# Patient Record
Sex: Male | Born: 1985 | Race: Black or African American | Hispanic: No | Marital: Single | Smoking: Former smoker
Health system: Southern US, Community
[De-identification: ages and names within clinical notes are randomized; demographics above are authoritative.]

## PROBLEM LIST (undated history)

## (undated) HISTORY — PX: HAND SURGERY: SHX662

---
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
## 2010-12-02 ENCOUNTER — Encounter: Payer: Self-pay | Admitting: Emergency Medicine

## 2010-12-02 ENCOUNTER — Emergency Department (HOSPITAL_COMMUNITY)
Admission: EM | Admit: 2010-12-02 | Discharge: 2010-12-02 | Disposition: A | Discharge status: Home / Self Care | Payer: Self-pay | Attending: Emergency Medicine | Admitting: Emergency Medicine

## 2010-12-02 DIAGNOSIS — F17200 Nicotine dependence, unspecified, uncomplicated: Secondary | ICD-10-CM | POA: Insufficient documentation

## 2010-12-02 DIAGNOSIS — N342 Other urethritis: Secondary | ICD-10-CM | POA: Insufficient documentation

## 2010-12-02 LAB — URINE MICROSCOPIC-ADD ON

## 2010-12-02 LAB — URINALYSIS, ROUTINE W REFLEX MICROSCOPIC
Glucose, UA: NEGATIVE mg/dL
Ketones, ur: NEGATIVE mg/dL
Protein, ur: NEGATIVE mg/dL
Urobilinogen, UA: 0.2 mg/dL (ref 0.0–1.0)

## 2010-12-02 MED ORDER — LIDOCAINE HCL (PF) 1 % IJ SOLN
INTRAMUSCULAR | Status: AC
  Administered 2010-12-02: 5 mL
  Filled 2010-12-02: qty 5

## 2010-12-02 MED ORDER — CEFTRIAXONE SODIUM 1 G IJ SOLR
250.0000 mg | Freq: Once | INTRAMUSCULAR | Status: AC
  Administered 2010-12-02: 250 mg via INTRAMUSCULAR

## 2010-12-02 MED ORDER — AZITHROMYCIN 250 MG PO TABS
1000.0000 mg | ORAL_TABLET | Freq: Once | ORAL | Status: AC
  Administered 2010-12-02: 1000 mg via ORAL
  Filled 2010-12-02: qty 4

## 2010-12-02 MED ORDER — CEFTRIAXONE SODIUM 250 MG IJ SOLR
INTRAMUSCULAR | Status: AC
  Filled 2010-12-02: qty 250

## 2010-12-02 NOTE — ED Provider Notes (Signed)
History     CSN: [id number] Arrival date & time: 12/02/2010  8:08 AM   First MD Initiated Contact with Patient 12/02/10 [phone]      Chief Complaint  Patient presents with  . Exposure to STD    (Consider location/radiation/quality/duration/timing/severity/associated sxs/prior treatment) Patient is a 25 y.o. male presenting with STD exposure. The history is provided by the patient.  Exposure to STD This is a new problem. The current episode started yesterday. Episode frequency: He reports burning with urination and urethral discharge since yesterday.  He has had unprotected sex with more than one partner in the recent past. The problem has been unchanged. Pertinent negatives include no abdominal pain, arthralgias, chest pain, chills, congestion, fever, headaches, joint swelling, nausea, neck pain, numbness, rash, sore throat, swollen glands or weakness. Exacerbated by: Urination. He has tried nothing for the symptoms.    History reviewed. No pertinent past medical history.  Past Surgical History  Procedure Date  . Hand surgery     Family History  Problem Relation Age of Onset  . Diabetes Sister     History  Substance Use Topics  . Smoking status: Current Everyday Smoker  . Smokeless tobacco: Not on file  . Alcohol Use: Yes      Review of Systems  Constitutional: Negative for fever and chills.  HENT: Negative for congestion, sore throat and neck pain.   Eyes: Negative.   Respiratory: Negative for chest tightness and shortness of breath.   Cardiovascular: Negative for chest pain.  Gastrointestinal: Negative for nausea and abdominal pain.  Genitourinary: Positive for dysuria and discharge.  Musculoskeletal: Negative for joint swelling and arthralgias.  Skin: Negative.  Negative for rash and wound.  Neurological: Negative for dizziness, weakness, light-headedness, numbness and headaches.  Hematological: Negative.   Psychiatric/Behavioral: Negative.     Allergies    Review of patient's allergies indicates no known allergies.  Home Medications  No current outpatient prescriptions on file.  BP 116/75  Pulse 66  Temp 97.9 F (36.6 C)  Resp 20  Ht 6\' 2"  (1.88 m)  Wt 175 lb (79.379 kg)  BMI 22.47 kg/m2  SpO2 99%  Physical Exam  Nursing note and vitals reviewed. Constitutional: He is oriented to person, place, and time. He appears well-developed and well-nourished.  HENT:  Head: Normocephalic and atraumatic.  Eyes: Conjunctivae are normal.  Neck: Normal range of motion.  Cardiovascular: Normal rate, regular rhythm, normal heart sounds and intact distal pulses.   Pulmonary/Chest: Effort normal and breath sounds normal. He has no wheezes.  Abdominal: Soft. Bowel sounds are normal. There is no tenderness.  Genitourinary: Testes normal. Circumcised. No penile tenderness. Discharge found.  Musculoskeletal: Normal range of motion.  Neurological: He is alert and oriented to person, place, and time.  Skin: Skin is warm and dry.  Psychiatric: He has a normal mood and affect.    ED Course  Procedures (including critical care time)  Labs Reviewed  URINALYSIS, ROUTINE W REFLEX MICROSCOPIC - Abnormal; Notable for the following:    Appearance HAZY (*)    Hgb urine dipstick TRACE (*)    Leukocytes, UA SMALL (*)    All other components within normal limits  URINE MICROSCOPIC-ADD ON - Abnormal; Notable for the following:    Bacteria, UA MANY (*)    All other components within normal limits  GC/CHLAMYDIA PROBE AMP, GENITAL  RPR   No results found.   No diagnosis found.    MDM  Patient treated with zithromax 1 gram  PO and rocephin 250 mg IM,  Gc/chlamydia/rpr cultures pending.        Candis Musa, PA 12/02/10 [phone]

## 2010-12-02 NOTE — ED Notes (Signed)
Pt c/o recent exposure to std. Pt c/o d/c from penis.

## 2010-12-02 NOTE — ED Provider Notes (Signed)
Medical screening examination/treatment/procedure(s) were performed by non-physician practitioner and as supervising physician I was immediately available for consultation/collaboration.   Nelia Shi, MD 12/02/10 [phone]

## 2010-12-05 LAB — GC/CHLAMYDIA PROBE AMP, GENITAL: GC Probe Amp, Genital: POSITIVE — AB

## 2011-01-24 ENCOUNTER — Encounter (HOSPITAL_COMMUNITY): Payer: Self-pay

## 2011-01-24 ENCOUNTER — Emergency Department (HOSPITAL_COMMUNITY)
Admission: EM | Admit: 2011-01-24 | Discharge: 2011-01-24 | Disposition: A | Discharge status: Home / Self Care | Payer: Self-pay | Attending: Emergency Medicine | Admitting: Emergency Medicine

## 2011-01-24 DIAGNOSIS — Z202 Contact with and (suspected) exposure to infections with a predominantly sexual mode of transmission: Secondary | ICD-10-CM | POA: Insufficient documentation

## 2011-01-24 DIAGNOSIS — R300 Dysuria: Secondary | ICD-10-CM | POA: Insufficient documentation

## 2011-01-24 DIAGNOSIS — F17200 Nicotine dependence, unspecified, uncomplicated: Secondary | ICD-10-CM | POA: Insufficient documentation

## 2011-01-24 LAB — URINALYSIS, ROUTINE W REFLEX MICROSCOPIC
Leukocytes, UA: NEGATIVE
Nitrite: NEGATIVE
Protein, ur: NEGATIVE mg/dL
Urobilinogen, UA: 2 mg/dL — ABNORMAL HIGH (ref 0.0–1.0)

## 2011-01-24 MED ORDER — AZITHROMYCIN 250 MG PO TABS
1000.0000 mg | ORAL_TABLET | Freq: Once | ORAL | Status: AC
  Administered 2011-01-24: 1000 mg via ORAL
  Filled 2011-01-24: qty 4

## 2011-01-24 MED ORDER — CEFTRIAXONE SODIUM 250 MG IJ SOLR
250.0000 mg | Freq: Once | INTRAMUSCULAR | Status: AC
  Administered 2011-01-24: 250 mg via INTRAMUSCULAR
  Filled 2011-01-24: qty 250

## 2011-01-24 MED ORDER — LIDOCAINE HCL (PF) 1 % IJ SOLN
INTRAMUSCULAR | Status: AC
  Administered 2011-01-24: 5 mL
  Filled 2011-01-24: qty 5

## 2011-01-24 NOTE — ED Provider Notes (Signed)
History     CSN: [id number] Arrival date & time: 01/24/2011  1:21 PM   First MD Initiated Contact with Patient 01/24/11 1354      Chief Complaint  Patient presents with  . Exposure to STD    (Consider location/radiation/quality/duration/timing/severity/associated sxs/prior treatment) HPI Comments: Pt recently had intercourse with male who reportedly has chlamydia.  Pt wants to be treated.  Denies penile d/c.  + dysuria o/w no UTI sxs.  Patient is a 25 y.o. male presenting with STD exposure. The history is provided by the patient. No language interpreter was used.  Exposure to STD This is a new problem. Episode onset: severa; days ago. The problem occurs constantly. The problem has been unchanged. Associated symptoms include urinary symptoms. He has tried nothing for the symptoms.    History reviewed. No pertinent past medical history.  Past Surgical History  Procedure Date  . Hand surgery     Family History  Problem Relation Age of Onset  . Diabetes Sister     History  Substance Use Topics  . Smoking status: Current Everyday Smoker  . Smokeless tobacco: Not on file  . Alcohol Use: Yes      Review of Systems  Genitourinary: Positive for dysuria. Negative for discharge.  All other systems reviewed and are negative.    Allergies  Bee venom  Home Medications   Current Outpatient Rx  Name Route Sig Dispense Refill  . THERA M PLUS PO TABS Oral Take 1 tablet by mouth daily.        BP 114/59  Pulse 71  Temp(Src) 98.3 F (36.8 C) (Oral)  Resp 18  Ht 6\' 2"  (1.88 m)  Wt 170 lb (77.111 kg)  BMI 21.83 kg/m2  SpO2 100%  Physical Exam  Nursing note and vitals reviewed. Constitutional: He is oriented to person, place, and time. He appears well-developed and well-nourished.  HENT:  Head: Normocephalic and atraumatic.  Eyes: EOM are normal.  Neck: Normal range of motion.  Cardiovascular: Normal rate, regular rhythm, normal heart sounds and intact distal  pulses.   Pulmonary/Chest: Effort normal and breath sounds normal. No respiratory distress.  Abdominal: Soft. He exhibits no distension. There is no tenderness.  Genitourinary: Testes normal and penis normal. Right testis shows no mass, no swelling and no tenderness. Left testis shows no mass, no swelling and no tenderness. Circumcised. No penile tenderness. No discharge found.  Musculoskeletal: Normal range of motion.  Neurological: He is alert and oriented to person, place, and time.  Skin: Skin is warm and dry.  Psychiatric: He has a normal mood and affect. Judgment normal.    ED Course  Procedures (including critical care time)   Labs Reviewed  URINALYSIS, ROUTINE W REFLEX MICROSCOPIC   No results found.   No diagnosis found.    MDM          Worthy Rancher, PA 01/24/11 [phone]

## 2011-01-24 NOTE — ED Notes (Signed)
Pt presents to be checked for Chlamydia. Pt had sexual intercourse with a woman that was diagnosed recently with Chlamydia.

## 2011-01-24 NOTE — ED Notes (Signed)
Pt states was told yesterday that he was infected with chlamydia by old girl friend.  States girl friend was treated here approximately 1 week ago.  Burning with urination and urinary frequency also reported . Urine sample obtained.

## 2011-01-26 NOTE — ED Provider Notes (Signed)
Medical screening examination/treatment/procedure(s) were performed by non-physician practitioner and as supervising physician I was immediately available for consultation/collaboration.  Tahja Liao, MD 01/26/11 2256 

## 2011-09-11 ENCOUNTER — Emergency Department (HOSPITAL_COMMUNITY)
Admission: EM | Admit: 2011-09-11 | Discharge: 2011-09-11 | Disposition: A | Discharge status: Home / Self Care | Payer: Self-pay | Attending: Emergency Medicine | Admitting: Emergency Medicine

## 2011-09-11 ENCOUNTER — Encounter (HOSPITAL_COMMUNITY): Payer: Self-pay | Admitting: *Deleted

## 2011-09-11 ENCOUNTER — Emergency Department (HOSPITAL_COMMUNITY): Payer: Self-pay

## 2011-09-11 DIAGNOSIS — IMO0002 Reserved for concepts with insufficient information to code with codable children: Secondary | ICD-10-CM | POA: Insufficient documentation

## 2011-09-11 DIAGNOSIS — S63609A Unspecified sprain of unspecified thumb, initial encounter: Secondary | ICD-10-CM

## 2011-09-11 DIAGNOSIS — S6390XA Sprain of unspecified part of unspecified wrist and hand, initial encounter: Secondary | ICD-10-CM | POA: Insufficient documentation

## 2011-09-11 MED ORDER — IBUPROFEN 800 MG PO TABS
800.0000 mg | ORAL_TABLET | Freq: Once | ORAL | Status: AC
  Administered 2011-09-11: 800 mg via ORAL
  Filled 2011-09-11: qty 1

## 2011-09-11 MED ORDER — IBUPROFEN 800 MG PO TABS: 800.0000 mg | ORAL_TABLET | Freq: Three times a day (TID) | ORAL | Status: AC

## 2011-09-11 MED ORDER — HYDROCODONE-ACETAMINOPHEN 5-325 MG PO TABS
ORAL_TABLET | ORAL | Status: AC
Start: 2011-09-11 — End: 2011-09-21

## 2011-09-11 MED ORDER — HYDROCODONE-ACETAMINOPHEN 5-325 MG PO TABS
1.0000 | ORAL_TABLET | Freq: Once | ORAL | Status: AC
  Administered 2011-09-11: 1 via ORAL
  Filled 2011-09-11: qty 1

## 2011-09-11 NOTE — ED Notes (Signed)
Pt states he jammed right thumb yesterday while playing basketball and has had throbbing pain to thumb since.

## 2011-09-13 NOTE — ED Provider Notes (Signed)
History     CSN: [id number]  Arrival date & time 09/11/11  1337   First MD Initiated Contact with Patient 09/11/11 1435      Chief Complaint  Patient presents with  . Finger Injury    (Consider location/radiation/quality/duration/timing/severity/associated sxs/prior treatment) Patient is a 26 y.o. male presenting with hand pain. The history is provided by the patient.  Hand Pain This is a new problem. The current episode started yesterday. The problem occurs constantly. The problem has been unchanged. Associated symptoms include arthralgias and joint swelling. Pertinent negatives include no fever, headaches, myalgias, nausea, numbness or weakness. The symptoms are aggravated by bending (palaption). He has tried NSAIDs for the symptoms. The treatment provided mild relief.    History reviewed. No pertinent past medical history.  Past Surgical History  Procedure Date  . Hand surgery     Family History  Problem Relation Age of Onset  . Diabetes Sister     History  Substance Use Topics  . Smoking status: Current Everyday Smoker  . Smokeless tobacco: Not on file  . Alcohol Use: Yes     Occ      Review of Systems  Constitutional: Negative for fever.  Gastrointestinal: Negative for nausea.  Musculoskeletal: Positive for joint swelling and arthralgias. Negative for myalgias.  Skin: Negative for color change and wound.  Neurological: Negative for weakness, numbness and headaches.  All other systems reviewed and are negative.    Allergies  Bee venom and Spider antivenin  Home Medications   Current Outpatient Rx  Name Route Sig Dispense Refill  . THERA M PLUS PO TABS Oral Take 1 tablet by mouth daily.      Marland Kitchen HYDROCODONE-ACETAMINOPHEN 5-325 MG PO TABS  Take one-two tabs po q 4-6 hrs prn pain 15 tablet 0  . IBUPROFEN 800 MG PO TABS Oral Take 1 tablet (800 mg total) by mouth 3 (three) times daily. 21 tablet 0  . NAPROXEN SODIUM 220 MG PO TABS Oral Take 220 mg by mouth 2  (two) times daily as needed. Headache       BP 117/51  Pulse 71  Temp 98.1 F (36.7 C) (Oral)  Resp 16  SpO2 100%  Physical Exam  Nursing note and vitals reviewed. Constitutional: He is oriented to person, place, and time. He appears well-developed and well-nourished. No distress.  HENT:  Head: Normocephalic and atraumatic.  Cardiovascular: Normal rate, regular rhythm and normal heart sounds.   Pulmonary/Chest: Effort normal and breath sounds normal.  Musculoskeletal: He exhibits tenderness. He exhibits no edema.       Left hand: He exhibits decreased range of motion and tenderness. He exhibits normal two-point discrimination, normal capillary refill, no deformity, no laceration and no swelling. normal sensation noted. Normal strength noted.       Hands:      ttp of the left proximal thumb.  Radial pulse is brisk, sensation intact.  CR< 2 sec.  No bruising or deformity.  Pain is reproduced with flexion.    Neurological: He is alert and oriented to person, place, and time. He exhibits normal muscle tone. Coordination normal.  Skin: Skin is warm and dry.    ED Course  Procedures (including critical care time)  Labs Reviewed - No data to display Dg Finger Thumb Left  09/11/2011  *RADIOLOGY REPORT*  Clinical Data: Jammed thumb, now with pain and swelling involving the MCP joint.  LEFT THUMB 2+V  Comparison: None.  Findings: There is mild soft tissue swelling about  the MCP joint of the thumb without definite fracture or dislocation.  Joint spaces are preserved.  No erosions. No radiopaque foreign body.  IMPRESSION: Mild soft tissue swelling about the MCP joint of the thumb without associated fracture, dislocation or radiopaque foreign body.  Original Report Authenticated By: Waynard Reeds, M.D.     1. Sprain, thumb       MDM    velcro thumb spica applied by nursing.  Pain improved.  Remains NV intact.  Likely ligamentous injury.  Pt agrees to close f/u with ortho.  The  patient appears reasonably screened and/or stabilized for discharge and I doubt any other medical condition or other [facility] requiring further screening, evaluation, or treatment in the ED at this time prior to discharge.   Prescribed:  norco # 15 ibuprofen        Jaymie Mckiddy L. Buell Parcel, [place] 09/13/11 1358

## 2011-09-15 NOTE — ED Provider Notes (Signed)
Medical screening examination/treatment/procedure(s) were performed by non-physician practitioner and as supervising physician I was immediately available for consultation/collaboration.  Juliet Rude. Rubin Payor, MD 09/15/11 1053

## 2012-04-14 ENCOUNTER — Emergency Department (HOSPITAL_COMMUNITY): Payer: Self-pay

## 2012-04-14 ENCOUNTER — Encounter (HOSPITAL_COMMUNITY): Payer: Self-pay

## 2012-04-14 ENCOUNTER — Inpatient Hospital Stay (HOSPITAL_COMMUNITY)
Admission: EM | Admit: 2012-04-14 | Discharge: 2012-04-15 | DRG: 201 | Discharge status: Against Medical Advice | Payer: MEDICAID | Attending: Pulmonary Disease | Admitting: Pulmonary Disease

## 2012-04-14 DIAGNOSIS — J982 Interstitial emphysema: Principal | ICD-10-CM | POA: Diagnosis present

## 2012-04-14 DIAGNOSIS — T43601A Poisoning by unspecified psychostimulants, accidental (unintentional), initial encounter: Secondary | ICD-10-CM | POA: Diagnosis present

## 2012-04-14 DIAGNOSIS — E876 Hypokalemia: Secondary | ICD-10-CM | POA: Diagnosis present

## 2012-04-14 DIAGNOSIS — T405X1A Poisoning by cocaine, accidental (unintentional), initial encounter: Secondary | ICD-10-CM | POA: Diagnosis present

## 2012-04-14 DIAGNOSIS — E860 Dehydration: Secondary | ICD-10-CM | POA: Diagnosis present

## 2012-04-14 DIAGNOSIS — F1410 Cocaine abuse, uncomplicated: Secondary | ICD-10-CM

## 2012-04-14 DIAGNOSIS — T7840XA Allergy, unspecified, initial encounter: Secondary | ICD-10-CM

## 2012-04-14 DIAGNOSIS — F17200 Nicotine dependence, unspecified, uncomplicated: Secondary | ICD-10-CM | POA: Diagnosis present

## 2012-04-14 LAB — CBC WITH DIFFERENTIAL/PLATELET
Basophils Absolute: 0 10*3/uL (ref 0.0–0.1)
Eosinophils Relative: 0 % (ref 0–5)
Lymphocytes Relative: 24 % (ref 12–46)
MCV: 76.8 fL — ABNORMAL LOW (ref 78.0–100.0)
Neutro Abs: 5 10*3/uL (ref 1.7–7.7)
Neutrophils Relative %: 62 % (ref 43–77)
Platelets: 269 10*3/uL (ref 150–400)
RDW: 15.1 % (ref 11.5–15.5)
WBC: 7.9 10*3/uL (ref 4.0–10.5)

## 2012-04-14 LAB — HEPATIC FUNCTION PANEL
Albumin: 4.7 g/dL (ref 3.5–5.2)
Alkaline Phosphatase: 63 U/L (ref 39–117)
Total Protein: 8.2 g/dL (ref 6.0–8.3)

## 2012-04-14 LAB — URINALYSIS, MICROSCOPIC ONLY
Hgb urine dipstick: NEGATIVE
Nitrite: NEGATIVE
Specific Gravity, Urine: 1.025 (ref 1.005–1.030)
pH: 7 (ref 5.0–8.0)

## 2012-04-14 LAB — RAPID URINE DRUG SCREEN, HOSP PERFORMED
Barbiturates: NOT DETECTED
Benzodiazepines: NOT DETECTED
Cocaine: POSITIVE — AB
Opiates: NOT DETECTED

## 2012-04-14 LAB — BLOOD GAS, ARTERIAL
Acid-base deficit: 4.7 mmol/L — ABNORMAL HIGH (ref 0.0–2.0)
FIO2: 0.21 %
pCO2 arterial: 36.6 mmHg (ref 35.0–45.0)
pH, Arterial: 7.354 (ref 7.350–7.450)
pO2, Arterial: 65.6 mmHg — ABNORMAL LOW (ref 80.0–100.0)

## 2012-04-14 LAB — LIPASE, BLOOD: Lipase: 33 U/L (ref 11–59)

## 2012-04-14 LAB — BASIC METABOLIC PANEL
CO2: 19 mEq/L (ref 19–32)
Calcium: 10 mg/dL (ref 8.4–10.5)
Sodium: 132 mEq/L — ABNORMAL LOW (ref 135–145)

## 2012-04-14 LAB — SALICYLATE LEVEL: Salicylate Lvl: <2 mg/dL — ABNORMAL LOW (ref 2.8–20.0)

## 2012-04-14 MED ORDER — FAMOTIDINE IN NACL 20-0.9 MG/50ML-% IV SOLN
20.0000 mg | Freq: Once | INTRAVENOUS | Status: AC
  Administered 2012-04-14: 20 mg via INTRAVENOUS
  Filled 2012-04-14: qty 50

## 2012-04-14 MED ORDER — IOHEXOL 300 MG/ML  SOLN
75.0000 mL | Freq: Once | INTRAMUSCULAR | Status: AC | PRN
  Administered 2012-04-14: 75 mL via INTRAVENOUS

## 2012-04-14 MED ORDER — DIPHENHYDRAMINE HCL 50 MG/ML IJ SOLN
25.0000 mg | Freq: Once | INTRAMUSCULAR | Status: AC
  Administered 2012-04-14: 25 mg via INTRAVENOUS
  Filled 2012-04-14: qty 1

## 2012-04-14 MED ORDER — SODIUM CHLORIDE 0.9 % IV SOLN
INTRAVENOUS | Status: DC
  Administered 2012-04-15: 02:00:00 via INTRAVENOUS

## 2012-04-14 MED ORDER — SODIUM CHLORIDE 0.9 % IV SOLN
INTRAVENOUS | Status: DC
  Administered 2012-04-14: 17:00:00 via INTRAVENOUS
  Administered 2012-04-15: 100 mL/h via INTRAVENOUS

## 2012-04-14 MED ORDER — LORAZEPAM 2 MG/ML IJ SOLN
1.0000 mg | Freq: Once | INTRAMUSCULAR | Status: AC
  Administered 2012-04-14: 1 mg via INTRAVENOUS
  Filled 2012-04-14: qty 1

## 2012-04-14 MED ORDER — SODIUM CHLORIDE 0.9 % IV BOLUS (SEPSIS)
500.0000 mL | Freq: Once | INTRAVENOUS | Status: AC
  Administered 2012-04-14: 500 mL via INTRAVENOUS

## 2012-04-14 MED ORDER — METHYLPREDNISOLONE SODIUM SUCC 125 MG IJ SOLR
125.0000 mg | Freq: Once | INTRAMUSCULAR | Status: AC
  Administered 2012-04-14: 125 mg via INTRAVENOUS
  Filled 2012-04-14: qty 2

## 2012-04-14 MED ORDER — POTASSIUM CHLORIDE 10 MEQ/100ML IV SOLN
10.0000 meq | Freq: Once | INTRAVENOUS | Status: AC
  Administered 2012-04-14: 10 meq via INTRAVENOUS
  Filled 2012-04-14: qty 100

## 2012-04-14 MED ORDER — ONDANSETRON HCL 4 MG/2ML IJ SOLN
4.0000 mg | Freq: Once | INTRAMUSCULAR | Status: AC
Start: 2012-04-14 — End: 2012-04-14
  Administered 2012-04-14: 4 mg via INTRAVENOUS
  Filled 2012-04-14: qty 2

## 2012-04-14 NOTE — ED Notes (Signed)
Patient states symptoms of throat swelling that started about one hour ago. Patient states he thinks he overdosed on cocaine. Does not know how much cocaine he used. Patient complaining of generalized pain. Patient says today is the first time he has ever used cocaine.

## 2012-04-14 NOTE — ED Provider Notes (Signed)
History  This chart was scribed for Jim Jakes, MD by Bennett Scrape, ED Scribe. This patient was seen in room APA07/APA07 and the patient's care was started at 3:31 PM.  CSN: [id number]  Arrival date & time 04/14/12  1516   First MD Initiated Contact with Patient 04/14/12 1531      Chief Complaint  Patient presents with  . Drug Overdose    Patient is a 27 y.o. male presenting with Overdose. The history is provided by the patient. No language interpreter was used.  Drug Overdose This is a new problem. The current episode started 6 to 12 hours ago. The problem occurs constantly. The problem has not changed since onset.Pertinent negatives include no chest pain, no abdominal pain, no headaches and no shortness of breath. Nothing aggravates the symptoms. Nothing relieves the symptoms. He has tried nothing for the symptoms.     Jim Gay is a 27 y.o. male who presents to the Emergency Department complaining of an overdose on cocaine powder. He states that he has been using cocaine "all night", last snorted about 6 hours ago. He is unsure how much cocaine he used. He states that he started having facial swelling with associated throat swelling sometime after he last used cocaine. He also reports a generalized feeling of weakness and numbness but states that he was able to make a phone call to have his father come pick him up PTA. He states that he had a HA earlier today that is now resolved but denies acute CP, abdominal pain and visual disturbances as associated symptoms. He reports that he has grass on feet from trying to leave out the back of the ED after being roomed but returned for evaluation upon insistence of family member. He does not have a h/o chronic medical conditions. He is a current everyday smoker and occasional alcohol user.  No PCP currently, sees [facility].  History reviewed. No pertinent past medical history.  Past Surgical History  Procedure Laterality  Date  . Hand surgery      Family History  Problem Relation Age of Onset  . Diabetes Sister     History  Substance Use Topics  . Smoking status: Current Every Day Smoker -- 2.00 packs/day    Types: Cigarettes  . Smokeless tobacco: Not on file  . Alcohol Use: No     Comment: Occ      Review of Systems  Constitutional: Negative for fever and chills.  HENT: Positive for facial swelling. Negative for congestion and rhinorrhea.   Eyes: Negative for visual disturbance.  Respiratory: Negative for shortness of breath.   Cardiovascular: Negative for chest pain.  Gastrointestinal: Negative for nausea, vomiting, abdominal pain and diarrhea.  Genitourinary: Negative for dysuria and hematuria.  Musculoskeletal: Positive for myalgias (generalized ). Negative for back pain.  Skin: Negative for rash.  Neurological: Positive for weakness (generalized) and numbness (generalized ). Negative for headaches.  All other systems reviewed and are negative.    Allergies  Bee venom and Spider antivenin  Home Medications   No current outpatient prescriptions on file.  Triage Vitals: BP 122/68  Pulse 109  Temp(Src) 98.1 F (36.7 C)  Resp 18  Ht 6\' 2"  (1.88 m)  Wt 180 lb (81.647 kg)  BMI 23.1 kg/m2  SpO2 100%  Physical Exam  Nursing note and vitals reviewed. Constitutional: He is oriented to person, place, and time. He appears well-developed and well-nourished. No distress.  HENT:  Head: Normocephalic and atraumatic.  Mouth/Throat: Oropharynx is clear and moist.  Minimal periorbital and mouth edema worse in the upper lip, no tongue edema, no edema to the floor of the mouth, moist MM, clear mucous discharge in the left nare  Eyes: Conjunctivae and EOM are normal. Pupils are equal, round, and reactive to light.  Neck: Normal range of motion. Neck supple. No tracheal deviation present.  Cardiovascular: Regular rhythm.  Tachycardia present.   No murmur heard. HR 102, Right radial pulse is  2+, left radial pulse is 2+  Pulmonary/Chest: Effort normal and breath sounds normal. No respiratory distress. He has no wheezes. He has no rales.  Abdominal: Soft. Bowel sounds are normal. There is no tenderness.  Musculoskeletal: Normal range of motion. He exhibits no edema.  Lymphadenopathy:    He has no cervical adenopathy.  Neurological: He is alert and oriented to person, place, and time. No cranial nerve deficit.  Pt able to move both sets of fingers and toes  Skin: Skin is warm and dry.  Psychiatric: His behavior is normal. His mood appears anxious.    ED Course  Procedures (including critical care time)  DIAGNOSTIC STUDIES: Oxygen Saturation is 100% on room air, normal by my interpretation.    COORDINATION OF CARE: 4:31 PM-Advised pt that his symptoms are atypical of a cocaine overdose. Discussed treatment plan which includes CT of head, CXR, drug panel, UA, CBC panel and medications with pt at bedside and pt agreed to plan.   4:45 PM- Ordered 500 mL of bolus, 4 mg Zofran injection, 25 mg benadryl injection, 20 mg Pepcid IVPB, 125 mg Solumedrol injection and 1 mg Ativan injection    5:15 PM- Ordered 100 ml potassium chloride IVPB  7:10 PM- Pt rechecked and is speaking more clearly. Facial swelling appears back to normal per family. Pt still reports a "werid feeling in the neck" so will order CT neck.   9:00 PM- Pt rechecked and is sleepy. He will respond to verbal stimuli but only states "my neck hurts".  9:13 PM-Consult complete with Dr. Rito Ehrlich, Hospitalist. Patient case explained and discussed. He advises that the free tissue neck can be from snorting but warned that the pt could be sleepy due to high carbon dioxide levels. Advises to admit to pulmonary critical care. Call ended at 9:19 PM.  9:20 PM- Pulse Sats is 100% on room air. ED staff drawing blood for blood gas currently.  10:04 PM-Consult complete with Dr. Frederico Hamman, Pulmonogist. Patient case explained and discussed.  Dr. Frederico Hamman agrees to admit patient for further evaluation and treatment to [facility] ICU. Call ended at 10:10 PM  Labs Reviewed  CBC WITH DIFFERENTIAL - Abnormal; Notable for the following:    MCV 76.8 (*)    Monocytes Relative 13 (*)    Monocytes Absolute 1.1 (*)    All other components within normal limits  BASIC METABOLIC PANEL - Abnormal; Notable for the following:    Sodium 132 (*)    Potassium 3.0 (*)    Chloride 95 (*)    All other components within normal limits  URINALYSIS, MICROSCOPIC ONLY - Abnormal; Notable for the following:    Bilirubin Urine SMALL (*)    Ketones, ur >80 (*)    All other components within normal limits  URINE RAPID DRUG SCREEN (HOSP PERFORMED) - Abnormal; Notable for the following:    Cocaine POSITIVE (*)    Tetrahydrocannabinol POSITIVE (*)    All other components within normal limits  SALICYLATE LEVEL - Abnormal; Notable for  the following:    Salicylate Lvl <2.0 (*)    All other components within normal limits  HEPATIC FUNCTION PANEL  LIPASE, BLOOD  ACETAMINOPHEN LEVEL  ETHANOL   Ct Head Wo Contrast  04/14/2012  *RADIOLOGY REPORT*  Clinical Data: Altered level of consciousness.  Overdose.  CT HEAD WITHOUT CONTRAST  Technique:  Contiguous axial images were obtained from the base of the skull through the vertex without contrast.  Comparison: None.  Findings: There is no evidence of acute intracranial hemorrhage, mass lesion, brain edema or extra-axial fluid collection.  The ventricles and subarachnoid spaces are appropriately sized for age. There is no CT evidence of acute cortical infarction.  There is mild mucosal thickening in the ethmoid sinuses bilaterally.  The additional visualized paranasal sinuses, mastoid air cells and middle ears are clear.  There appears to be prominent air surrounding the carotid arteries bilaterally, inferior to the skull base on image #1.  No intracranial air is identified. The calvarium is intact.  IMPRESSION:  1.  No acute  intracranial findings identified. 2.  Ethmoid sinus mucosal thickening bilaterally. 3.  Air surrounding the carotid arteries inferior to the skull base could be incidental and related to an intravenous catheter. However, the possibility of more extensive soft tissue emphysema within the neck should be considered.  I cannot exclude the possibility of retropharyngeal air anterior to the cervical spine on the scout image.  Consider neck CT for further assessment.   Original Report Authenticated By: Carey Bullocks, M.D.    Ct Soft Tissue Neck W Contrast  04/14/2012  *RADIOLOGY REPORT*  Clinical Data: Overdose with throat swelling.  Possible soft tissue emphysema on head CT.  CT NECK WITH CONTRAST  Technique:  Multidetector CT imaging of the neck was performed with intravenous contrast.  Contrast: 75mL OMNIPAQUE IOHEXOL 300 MG/ML  SOLN  Comparison: Head CT and chest radiographs earlier same date.  Findings: There is extensive soft tissue emphysema throughout the neck.  This tracks throughout the retropharyngeal space and along both jugular veins and carotid arteries.  There is extensive pneumomediastinum with asymmetric soft tissue emphysema in the right supraclavicular area.  No focal fluid collections are identified.  There is no obvious tracheal or esophageal abnormality. There is no evidence of airway compromise.  Mild maxillary sinus mucosal thickening is present bilaterally. There is no subdural air collection.  The cervical spine appears normal.  The lung apices are clear.  There is no apical pneumothorax.  IMPRESSION:  1.  There is extensive soft tissue emphysema throughout the neck associated with pneumomediastinum.  The etiology for this is not demonstrated by the current examination. 2.  Esophageal injury cannot be excluded.  A follow-up esophagram should be considered. Chest radiographic followup is also suggested to exclude developing pneumothorax. 3.  No focal fluid collection is identified to suggest  soft tissue abscess.  These results were called by telephone on 04/14/2012 at 2045 hours to Dr. Deretha Emory, who verbally acknowledged these results.   Original Report Authenticated By: Carey Bullocks, M.D.    Dg Chest Port 1 View  04/14/2012  *RADIOLOGY REPORT*  Clinical Data: Drug overdose  PORTABLE CHEST - 1 VIEW  Comparison: None.  Findings: Lungs are clear. No pleural effusion or pneumothorax.  Cardiomediastinal silhouette is within normal limits.  IMPRESSION: No evidence of acute cardiopulmonary disease.   Original Report Authenticated By: Charline Bills, M.D.      Date: 04/14/2012  Rate: 104  Rhythm: sinus tachycardia  QRS Axis: normal  Intervals: normal  ST/T Wave abnormalities: normal  Conduction Disutrbances:none  Narrative Interpretation:   Old EKG Reviewed: none available  Results for orders placed during the hospital encounter of 04/14/12  CBC WITH DIFFERENTIAL      Result Value Range   WBC 7.9  4.0 - 10.5 K/uL   RBC 5.21  4.22 - 5.81 MIL/uL   Hemoglobin 13.8  13.0 - 17.0 g/dL   HCT 84.6  96.2 - 95.2 %   MCV 76.8 (*) 78.0 - 100.0 fL   MCH 26.5  26.0 - 34.0 pg   MCHC 34.5  30.0 - 36.0 g/dL   RDW 84.1  32.4 - 40.1 %   Platelets 269  150 - 400 K/uL   Neutrophils Relative 62  43 - 77 %   Neutro Abs 5.0  1.7 - 7.7 K/uL   Lymphocytes Relative 24  12 - 46 %   Lymphs Abs 1.9  0.7 - 4.0 K/uL   Monocytes Relative 13 (*) 3 - 12 %   Monocytes Absolute 1.1 (*) 0.1 - 1.0 K/uL   Eosinophils Relative 0  0 - 5 %   Eosinophils Absolute 0.0  0.0 - 0.7 K/uL   Basophils Relative 0  0 - 1 %   Basophils Absolute 0.0  0.0 - 0.1 K/uL  BASIC METABOLIC PANEL      Result Value Range   Sodium 132 (*) 135 - 145 mEq/L   Potassium 3.0 (*) 3.5 - 5.1 mEq/L   Chloride 95 (*) 96 - 112 mEq/L   CO2 19  19 - 32 mEq/L   Glucose, Bld 91  70 - 99 mg/dL   BUN 13  6 - 23 mg/dL   Creatinine, Ser 0.27  0.50 - 1.35 mg/dL   Calcium 25.3  8.4 - 66.4 mg/dL   GFR calc non Af Amer >90  >90 mL/min   GFR calc  Af Amer >90  >90 mL/min  HEPATIC FUNCTION PANEL      Result Value Range   Total Protein 8.2  6.0 - 8.3 g/dL   Albumin 4.7  3.5 - 5.2 g/dL   AST 22  0 - 37 U/L   ALT 13  0 - 53 U/L   Alkaline Phosphatase 63  39 - 117 U/L   Total Bilirubin 1.0  0.3 - 1.2 mg/dL   Bilirubin, Direct 0.2  0.0 - 0.3 mg/dL   Indirect Bilirubin 0.8  0.3 - 0.9 mg/dL  LIPASE, BLOOD      Result Value Range   Lipase 33  11 - 59 U/L  URINALYSIS, MICROSCOPIC ONLY      Result Value Range   Color, Urine YELLOW  YELLOW   APPearance CLEAR  CLEAR   Specific Gravity, Urine 1.025  1.005 - 1.030   pH 7.0  5.0 - 8.0   Glucose, UA NEGATIVE  NEGATIVE mg/dL   Hgb urine dipstick NEGATIVE  NEGATIVE   Bilirubin Urine SMALL (*) NEGATIVE   Ketones, ur >80 (*) NEGATIVE mg/dL   Protein, ur NEGATIVE  NEGATIVE mg/dL   Urobilinogen, UA 1.0  0.0 - 1.0 mg/dL   Nitrite NEGATIVE  NEGATIVE   Leukocytes, UA NEGATIVE  NEGATIVE  URINE RAPID DRUG SCREEN (HOSP PERFORMED)      Result Value Range   Opiates NONE DETECTED  NONE DETECTED   Cocaine POSITIVE (*) NONE DETECTED   Benzodiazepines NONE DETECTED  NONE DETECTED   Amphetamines NONE DETECTED  NONE DETECTED   Tetrahydrocannabinol POSITIVE (*) NONE DETECTED   Barbiturates NONE  DETECTED  NONE DETECTED  ACETAMINOPHEN LEVEL      Result Value Range   Acetaminophen (Tylenol), Serum <15.0  10 - 30 ug/mL  ETHANOL      Result Value Range   Alcohol, Ethyl (B) <11  0 - 11 mg/dL  SALICYLATE LEVEL      Result Value Range   Salicylate Lvl <2.0 (*) 2.8 - 20.0 mg/dL     1. Cocaine abuse   2. Allergic reaction, initial encounter   3. Pneumomediastinum     CRITICAL CARE Performed by: Jim Gay.   Total critical care time: 30  Critical care time was exclusive of separately billable procedures and treating other patients.  Critical care was necessary to treat or prevent imminent or life-threatening deterioration.  Critical care was time spent personally by me on the following  activities: development of treatment plan with patient and/or surrogate as well as nursing, discussions with consultants, evaluation of patient's response to treatment, examination of patient, obtaining history from patient or surrogate, ordering and performing treatments and interventions, ordering and review of laboratory studies, ordering and review of radiographic studies, pulse oximetry and re-evaluation of patient's condition.   MDM  Patient with extensive workup in the emergency department. Cocaine abuse on a binge overnight and into the morning. And mild local with swelling of the face and throat numbness all over his body and weakness in all extremities. Patient arrived alert had swelling up in the maxillary area maybe some lip swelling no tongue swelling no lower lip swelling. This was thought maybe to be an allergic reaction patient was treated with IV Cipro Medrol Benadryl and Pepcid. That swelling actually resolved. Head CT was done for the complaint of headache and that showed some free air in the neck area. Chest x-ray originally was read as normal for the mediastinum but then and review later was changed to a pneumomediastinum. No evidence of pneumothorax. CT soft tissue neck was done which showed extensive soft tissue air around the neck and coming from the mediastinum area. This would be resulting from either a perforation of the esophagus or perforation around the mediastinum which could lead to a pneumothorax. In either case patient needed critical care observation. Discuss with the hospitalist here does not feel comfortable making him here which is probably appropriate discussed with pulmonary critical care at [facility] they will admit the acute or late. Transfer and admit orders completed. Patient had been alert throughout most of stay about an hour ago became somnolent figure this was probably related to the cocaine wearing off however a blood gas was obtained which had a borderline pH for  acidosis PCO2 was normal PO2 was on the low end in the 60s patient started on 2 L nasal cannula. Patient's CO2 on electrolytes was 19 and a CO2 on the blood gas was 18.      I personally performed the services described in this documentation, which was scribed in my presence. The recorded information has been reviewed and is accurate.     Jim Jakes, MD 04/14/12 2216

## 2012-04-14 NOTE — H&P (Signed)
PULMONARY  / CRITICAL CARE MEDICINE  Name: Jim Gay MRN: [id number] DOB: 1985-07-18    ADMISSION DATE:  04/14/2012  BRIEF PATIENT DESCRIPTION:  27 years old male with no significant PMH except for cocaine abuse. Presents referred from [facility] with cocaine overdose and spontaneous pneumomediastinum.  SIGNIFICANT EVENTS / STUDIES:  - No parenchymal disease on chest X ray. No pneumothorax - CT of the neck with extensive subcutaneous emphysema and pneumomediastinum without apparent cause.  LINES / TUBES: - Peripheral IV's  CULTURES: - Not indicated  ANTIBIOTICS: - No antibiotics  HISTORY OF PRESENT ILLNESS:   27 years old male with no significant PMH except for cocaine abuse. Presents transferred from [facility] for observation in the ICU with cocaine overdose and spontaneous pneumomediastinum. The patient states that he was using cocaine for about 12 hrs with the last dose about 6 hrs ago. At that time he experienced neck and facial swelling. He denies CP, SOB, wheezing, history of asthma,  nausea, vomiting, diarrhea or any other symptoms except for generalized weakness. He is a current smoker and drinks alcohol occasionally. At the time of my exam the patient is awake, alert, oriented and hemodynamically stable.   PAST MEDICAL HISTORY :  History reviewed. No pertinent past medical history. Past Surgical History  Procedure Laterality Date  . Hand surgery     Prior to Admission medications   Not on File   Allergies  Allergen Reactions  . Bee Venom Other (See Comments)    Possible anaphylaxis , this was a childhood allergy, patient is unsure  . Spider Antivenin (Antivenin Latrodectus Mactans) Swelling    FAMILY HISTORY:  Family History  Problem Relation Age of Onset  . Diabetes Sister    SOCIAL HISTORY:  reports that he has been smoking Cigarettes.  He has been smoking about 2.00 packs per day. He does not have any smokeless tobacco history on file. He reports that  he uses illicit drugs (Cocaine). He reports that he does not drink alcohol.  REVIEW OF SYSTEMS:  All systems reviewed and found negative except for what I mentioned in the HPI.  SUBJECTIVE:   VITAL SIGNS: Temp:  [98.1 F (36.7 C)] 98.1 F (36.7 C) (03/02 1519) Pulse Rate:  [94-109] 99 (03/02 1920) Resp:  [16-20] 20 (03/02 1833) BP: (122-131)/(63-72) 124/72 mmHg (03/02 1920) SpO2:  [98 %-100 %] 98 % (03/02 1920) Weight:  [180 lb (81.647 kg)] 180 lb (81.647 kg) (03/02 1519)      PHYSICAL EXAMINATION: General: No apparent distress. Eyes: Anicteric sclerae. ENT: Oropharynx clear. Dry mucous membranes. No thrush Lymph: No cervical, supraclavicular, or axillary lymphadenopathy. Heart: Normal S1, S2. No murmurs, rubs, or gallops appreciated. No bruits, equal pulses. Lungs: Palpable subcutaneous emphysema extending to the upper chest and neck. Normal chest excursion, no dullness to percussion. Good air movement bilaterally, without wheezes or crackles. Normal upper airway sounds without evidence of stridor. Abdomen: Abdomen soft, non-tender and not distended, normoactive bowel sounds. No hepatosplenomegaly or masses. Musculoskeletal: No clubbing or synovitis. Skin: No rashes or lesions Neuro: No focal neurologic deficits.  LABS:  Recent Labs Lab 04/14/12 1619 04/14/12 1639 04/14/12 2130  HGB 13.8  --   --   WBC 7.9  --   --   PLT 269  --   --   NA 132*  --   --   K 3.0*  --   --   CL 95*  --   --   CO2 19  --   --  GLUCOSE 91  --   --   BUN 13  --   --   CREATININE 0.97  --   --   CALCIUM 10.0  --   --   AST  --  22  --   ALT  --  13  --   ALKPHOS  --  63  --   BILITOT  --  1.0  --   PROT  --  8.2  --   ALBUMIN  --  4.7  --   PHART  --   --  7.354  PCO2ART  --   --  36.6  PO2ART  --   --  65.6*   No results found for this basename: GLUCAP,  in the last 168 hours  CXR:  - No parenchymal infiltrates  ASSESSMENT / PLAN:  PULMONARY A: 1) Spontaneous  pneumomediastinum likely associated to cocaine abuse. No associated pneumothorax. No evidence of esophageal perforation on CT. Good prognosis.  P:   - Will monitor in the ICU - Will repeat chest X ray in am - Monitor for signs of sepsis or hemodynamic instability. May need contrast esophagogram if suspicion for esophageal perforation.  CARDIOVASCULAR A:  1) Hemodynamically stable. Dehydrated on exam. P:  - NS 0.9% at 125 cc/hr for 12 hrs  RENAL A:   1) Hypokalemia P:   - Will replenish PO K   GASTROINTESTINAL A:   1) No issues.  P: - Clear liquids   HEMATOLOGIC A:   1) No issues   INFECTIOUS A:   1) No issues   ENDOCRINE A:   1) No issues     NEUROLOGIC A:   1) No issues    I have personally obtained a history, examined the patient, evaluated laboratory and imaging results, formulated the assessment and plan and placed orders. CRITICAL CARE: The patient is critically ill with multiple organ systems failure and requires high complexity decision making for assessment and support, frequent evaluation and titration of therapies, application of advanced monitoring technologies and extensive interpretation of multiple databases. Critical Care Time devoted to patient care services described in this note is 45 minutes.   Overton Mam, MD Pulmonary and Critical Care Medicine [facility] Pager: [phone]  04/14/2012, 10:30 PM

## 2012-04-14 NOTE — ED Notes (Signed)
Family contact information.  Theodoro Grist- father  Cell: [phone].   Mother - Lynden Ang, [phone]

## 2012-04-14 NOTE — ED Notes (Signed)
Patient transported to CT 

## 2012-04-14 NOTE — ED Notes (Addendum)
Pt drowsy, but arouses with verbal stimulation. Cooperative.  No respiratory distress noted.  VS stable.  Facial swelling decreased. Swallowing p.o fluids with no difficulty.

## 2012-04-15 ENCOUNTER — Inpatient Hospital Stay (HOSPITAL_COMMUNITY): Payer: Self-pay

## 2012-04-15 DIAGNOSIS — J982 Interstitial emphysema: Principal | ICD-10-CM

## 2012-04-15 DIAGNOSIS — F1410 Cocaine abuse, uncomplicated: Secondary | ICD-10-CM

## 2012-04-15 DIAGNOSIS — T7840XA Allergy, unspecified, initial encounter: Secondary | ICD-10-CM

## 2012-04-15 LAB — CBC
MCH: 26.1 pg (ref 26.0–34.0)
Platelets: 207 10*3/uL (ref 150–400)
RBC: 4.72 MIL/uL (ref 4.22–5.81)
WBC: 6.4 10*3/uL (ref 4.0–10.5)

## 2012-04-15 LAB — CREATININE, SERUM: Creatinine, Ser: 0.84 mg/dL (ref 0.50–1.35)

## 2012-04-15 LAB — GLUCOSE, CAPILLARY: Glucose-Capillary: 108 mg/dL — ABNORMAL HIGH (ref 70–99)

## 2012-04-15 LAB — MRSA PCR SCREENING: MRSA by PCR: NEGATIVE

## 2012-04-15 MED ORDER — MIDAZOLAM HCL 2 MG/2ML IJ SOLN: 1.0000 mg | INTRAMUSCULAR | Status: DC | PRN

## 2012-04-15 MED ORDER — HEPARIN SODIUM (PORCINE) 5000 UNIT/ML IJ SOLN
5000.0000 [IU] | Freq: Three times a day (TID) | INTRAMUSCULAR | Status: DC
  Administered 2012-04-15 (×3): 5000 [IU] via SUBCUTANEOUS
  Filled 2012-04-15 (×5): qty 1

## 2012-04-15 MED ORDER — SODIUM CHLORIDE 0.9 % IV SOLN: 250.0000 mL | INTRAVENOUS | Status: DC | PRN

## 2012-04-15 MED ORDER — NICOTINE 21 MG/24HR TD PT24
21.0000 mg | MEDICATED_PATCH | Freq: Every day | TRANSDERMAL | Status: DC
  Administered 2012-04-15: 21 mg via TRANSDERMAL
  Filled 2012-04-15: qty 1

## 2012-04-15 NOTE — Progress Notes (Signed)
CCM is presently at bedside with patient. Pt stated that he was ok, he denies chest pain or discomfort. He was cleared to eat and so he was offered a snack. He is eating and drinking without difficulties. We will continue to monitor.

## 2012-04-15 NOTE — H&P (Signed)
PULMONARY  / CRITICAL CARE MEDICINE  Name: Jim Gay MRN: [id number] DOB: May 31, 1985    ADMISSION DATE:  04/14/2012  BRIEF PATIENT DESCRIPTION:  27 years old male with no significant PMH except for cocaine abuse. Presents referred from [facility] with cocaine overdose and spontaneous pneumomediastinum.  SIGNIFICANT EVENTS / STUDIES:  - No parenchymal disease on chest X ray. No pneumothorax - CT of the neck with extensive subcutaneous emphysema and pneumomediastinum without apparent cause.  LINES / TUBES: - Peripheral IV's  CULTURES: - Not indicated  ANTIBIOTICS: - No antibiotics  SUBJECTIVE:  No distress  VITAL SIGNS: Temp:  [98 F (36.7 C)-98.5 F (36.9 C)] 98.2 F (36.8 C) (03/03 0745) Pulse Rate:  [72-109] 87 (03/03 0900) Resp:  [13-21] 19 (03/03 0900) BP: (95-131)/(55-73) 95/73 mmHg (03/03 0900) SpO2:  [94 %-100 %] 100 % (03/03 0900) Weight:  [73.8 kg (162 lb 11.2 oz)-81.647 kg (180 lb)] 73.8 kg (162 lb 11.2 oz) (03/03 0030)   PHYSICAL EXAMINATION: General: no distress in chair Neuro: awake , nonfocal agitated HEENT: crepitus min rt PULM: CTA CV:  s1 s2 rrr no r GI: soft bs wnl no r/g Extremities: no edema  LABS:  Recent Labs Lab 04/14/12 1619 04/14/12 1639 04/14/12 2130 04/15/12 0216  HGB 13.8  --   --  12.3*  WBC 7.9  --   --  6.4  PLT 269  --   --  207  NA 132*  --   --   --   K 3.0*  --   --   --   CL 95*  --   --   --   CO2 19  --   --   --   GLUCOSE 91  --   --   --   BUN 13  --   --   --   CREATININE 0.97  --   --  0.84  CALCIUM 10.0  --   --   --   AST  --  22  --   --   ALT  --  13  --   --   ALKPHOS  --  63  --   --   BILITOT  --  1.0  --   --   PROT  --  8.2  --   --   ALBUMIN  --  4.7  --   --   PHART  --   --  7.354  --   PCO2ART  --   --  36.6  --   PO2ART  --   --  65.6*  --     Recent Labs Lab 04/15/12 0025  GLUCAP 108*    CXR:  - No parenchymal infiltrates  ASSESSMENT / PLAN:  PULMONARY A: 1) Spontaneous  pneumomediastinum likely associated to cocaine abuse. No associated pneumothorax. No evidence of esophageal perforation on CT P:   - Will monitor in sdu - Will repeat chest X ray in am - need contrast esophagogram  CT to r/o  esophageal perforation.Worry about necrosis from coc , coughing, gaging from coc intox - perf -avoid cough -O2  CARDIOVASCULAR A:  1) Hemodynamically stable. Dehydrated on exam. P:  - NS to kvio -tele  RENAL A:   1) Hypokalemia treated P:   - chem in am   GASTROINTESTINAL A:   1) No issues.  P: - Clear liquids, advance after CT  HEMATOLOGIC A:   1) dvt prevention Sub q hep until ambulation  INFECTIOUS A:   1) No issues   ENDOCRINE A:   1) No issues     NEUROLOGIC A:   1) behavioral coc WD  benzo prn   Await sdu bed Mcarthur Rossetti. Tyson Alias, MD, FACP Pgr: [phone] [facility]

## 2012-04-15 NOTE — Discharge Summary (Signed)
Physician Discharge Summary     Patient ID: KORDEL LEAVY MRN: [id number] DOB/AGE: August 05, 1985 27 y.o.  Admit date: 04/14/2012 Discharge date: 04/15/2012  Admission Diagnoses: Pneumomediastinum   Discharge Diagnoses:  Active Problems:   Pneumomediastinum   Cocaine abuse   Significant Hospital tests/ studies/ interventions and procedures  CT chest 3/3: see below  BRIEF PATIENT DESCRIPTION:  27 years old male with no significant PMH except for cocaine abuse. Presents referred from [facility] with cocaine overdose and spontaneous pneumomediastinum.  SIGNIFICANT EVENTS / STUDIES:  - No parenchymal disease on chest X ray. No pneumothorax  - CT of the neck with extensive subcutaneous emphysema and pneumomediastinum without apparent cause.     Hospital Course:   1) Spontaneous pneumomediastinum likely associated to cocaine abuse. No associated pneumothorax. No evidence of esophageal perforation on CT.  He insists on leaving in spite of long discussion at bedside. He is leaving against medical advise on 3/3.   Discharge Exam: BP 108/58  Pulse 86  Temp(Src) 98.1 F (36.7 C) (Oral)  Resp 17  Ht 6\' 2"  (1.88 m)  Wt 73.8 kg (162 lb 11.2 oz)  BMI 20.88 kg/m2  SpO2 100% Exam deferred   Labs at discharge Lab Results  Component Value Date   CREATININE 0.84 04/15/2012   BUN 13 04/14/2012   NA 132* 04/14/2012   K 3.0* 04/14/2012   CL 95* 04/14/2012   CO2 19 04/14/2012   Lab Results  Component Value Date   WBC 6.4 04/15/2012   HGB 12.3* 04/15/2012   HCT 36.9* 04/15/2012   MCV 78.2 04/15/2012   PLT 207 04/15/2012   Lab Results  Component Value Date   ALT 13 04/14/2012   AST 22 04/14/2012   ALKPHOS 63 04/14/2012   BILITOT 1.0 04/14/2012   No results found for this basename: INR,  PROTIME    Current radiology studies Ct Head Wo Contrast  04/14/2012  *RADIOLOGY REPORT*  Clinical Data: Altered level of consciousness.  Overdose.  CT HEAD WITHOUT CONTRAST  Technique:  Contiguous axial images were  obtained from the base of the skull through the vertex without contrast.  Comparison: None.  Findings: There is no evidence of acute intracranial hemorrhage, mass lesion, brain edema or extra-axial fluid collection.  The ventricles and subarachnoid spaces are appropriately sized for age. There is no CT evidence of acute cortical infarction.  There is mild mucosal thickening in the ethmoid sinuses bilaterally.  The additional visualized paranasal sinuses, mastoid air cells and middle ears are clear.  There appears to be prominent air surrounding the carotid arteries bilaterally, inferior to the skull base on image #1.  No intracranial air is identified. The calvarium is intact.  IMPRESSION:  1.  No acute intracranial findings identified. 2.  Ethmoid sinus mucosal thickening bilaterally. 3.  Air surrounding the carotid arteries inferior to the skull base could be incidental and related to an intravenous catheter. However, the possibility of more extensive soft tissue emphysema within the neck should be considered.  I cannot exclude the possibility of retropharyngeal air anterior to the cervical spine on the scout image.  Consider neck CT for further assessment.   Original Report Authenticated By: Carey Bullocks, M.D.    Ct Soft Tissue Neck W Contrast  04/14/2012  *RADIOLOGY REPORT*  Clinical Data: Overdose with throat swelling.  Possible soft tissue emphysema on head CT.  CT NECK WITH CONTRAST  Technique:  Multidetector CT imaging of the neck was performed with intravenous contrast.  Contrast:  75mL OMNIPAQUE IOHEXOL 300 MG/ML  SOLN  Comparison: Head CT and chest radiographs earlier same date.  Findings: There is extensive soft tissue emphysema throughout the neck.  This tracks throughout the retropharyngeal space and along both jugular veins and carotid arteries.  There is extensive pneumomediastinum with asymmetric soft tissue emphysema in the right supraclavicular area.  No focal fluid collections are identified.   There is no obvious tracheal or esophageal abnormality. There is no evidence of airway compromise.  Mild maxillary sinus mucosal thickening is present bilaterally. There is no subdural air collection.  The cervical spine appears normal.  The lung apices are clear.  There is no apical pneumothorax.  IMPRESSION:  1.  There is extensive soft tissue emphysema throughout the neck associated with pneumomediastinum.  The etiology for this is not demonstrated by the current examination. 2.  Esophageal injury cannot be excluded.  A follow-up esophagram should be considered. Chest radiographic followup is also suggested to exclude developing pneumothorax. 3.  No focal fluid collection is identified to suggest soft tissue abscess.  These results were called by telephone on 04/14/2012 at 2045 hours to Dr. Deretha Emory, who verbally acknowledged these results.   Original Report Authenticated By: Carey Bullocks, M.D.    Ct Chest Wo Contrast  04/15/2012  *RADIOLOGY REPORT*  Clinical Data: Chest pain, neck swelling, cocaine use of yesterday, evaluate for esophageal rupture  CT CHEST WITHOUT CONTRAST  Technique:  Multidetector CT imaging of the chest was performed following the standard protocol without IV contrast.  Comparison: Chest radiograph 04/14/2012  Findings: There is pneumomediastinum as well as subcutaneous emphysema in the soft tissues of the neck.  Trachea is midline. There is no localized gas collection.  Oral contrast was administered, and a small amount is visualized at the level of the mid esophagus without evidence for contrast extravasation. Contrast does reach the stomach without evidence for obstruction, although the stomach itself and other abdominal structures are not specifically evaluated.  No lymphadenopathy.  Heart size is normal.  No pneumothorax is identified.  Central airways are patent.  The lungs are clear.  No acute osseous finding.  IMPRESSION: Pneumomediastinum and subcutaneous gas tracking along the  soft tissues of the neck.  Given the history of cocaine use, this may be associated with forceful inhalation/exhalation.  Accounting for technique, there is no extravasation of oral contrast from the esophagus to directly suggest esophageal rupture, although if there is continued clinical concern for this entity, single contrast esophagram would be recommended for further evaluation. These results were called by telephone on 04/15/2012 at 2:20 p.m. to Dr. Tyson Alias, who verbally acknowledged these results.   Original Report Authenticated By: Christiana Pellant, M.D.    Dg Chest Port 1 View  04/14/2012  *RADIOLOGY REPORT*  Clinical Data: Drug overdose  PORTABLE CHEST - 1 VIEW  Comparison: None.  Findings: Lungs are clear. No pleural effusion or pneumothorax.  Cardiomediastinal silhouette is within normal limits.  IMPRESSION: No evidence of acute cardiopulmonary disease.   Original Report Authenticated By: Charline Bills, M.D.     Disposition:  01-Home or Self Care Left AGAINST MEDICAL ADVISE      Discharge Orders   Future Orders Complete By Expires     Diet - low sodium heart healthy  As directed     Increase activity slowly  As directed     Scheduling Instructions:      Avoid lifting over 5 lbs. You need medical evaluation prior to returning to any level of activity  Medication List     As of 04/15/2012  3:25 PM    Notice      You have not been prescribed any medications.        Follow-up Information   Follow up with [facility]. (As needed if symptoms worsen)    Contact information:   [address] [place] [address] [phone]      Discharged Condition: guarded   Physician Statement:   The Patient was personally examined, the discharge assessment and plan has been personally reviewed and I agree with ACNP Babcock's assessment and plan. > 30 minutes of time have been dedicated to discharge assessment, planning and discharge instructions.    Signed: BABCOCK,PETE 04/15/2012, 3:25 PM  Ct reviewed with radiology, negative overall for esoph injury Requires further eval in [place] Refusing, understands risks  Mcarthur Rossetti. Tyson Alias, MD, FACP Pgr: [phone] [facility]

## 2012-11-08 ENCOUNTER — Emergency Department (HOSPITAL_COMMUNITY)
Admission: EM | Admit: 2012-11-08 | Discharge: 2012-11-08 | Disposition: A | Discharge status: Home / Self Care | Payer: Self-pay | Attending: Emergency Medicine | Admitting: Emergency Medicine

## 2012-11-08 ENCOUNTER — Encounter (HOSPITAL_COMMUNITY): Payer: Self-pay

## 2012-11-08 DIAGNOSIS — K089 Disorder of teeth and supporting structures, unspecified: Secondary | ICD-10-CM | POA: Insufficient documentation

## 2012-11-08 DIAGNOSIS — K0889 Other specified disorders of teeth and supporting structures: Secondary | ICD-10-CM

## 2012-11-08 DIAGNOSIS — F17200 Nicotine dependence, unspecified, uncomplicated: Secondary | ICD-10-CM | POA: Insufficient documentation

## 2012-11-08 DIAGNOSIS — Z792 Long term (current) use of antibiotics: Secondary | ICD-10-CM | POA: Insufficient documentation

## 2012-11-08 MED ORDER — TRAMADOL HCL 50 MG PO TABS: 50.0000 mg | ORAL_TABLET | Freq: Four times a day (QID) | ORAL | Status: DC | PRN

## 2012-11-08 MED ORDER — AMOXICILLIN 500 MG PO CAPS: 500.0000 mg | ORAL_CAPSULE | Freq: Three times a day (TID) | ORAL | Status: AC

## 2012-11-08 NOTE — ED Provider Notes (Signed)
Medical screening examination/treatment/procedure(s) were performed by non-physician practitioner and as supervising physician I was immediately available for consultation/collaboration.  Donnetta Hutching, MD 11/08/12 1538

## 2012-11-08 NOTE — ED Provider Notes (Signed)
CSN: [id number]     Arrival date & time 11/08/12  0865 History   First MD Initiated Contact with Patient 11/08/12 0805     Chief Complaint  Patient presents with  . Dental Pain   (Consider location/radiation/quality/duration/timing/severity/associated sxs/prior Treatment) HPI Comments: Jim Gay is a 27 y.o. Male presenting with a 7 day history of dental pain and gingival swelling.   The patient denies history of injury to the tooth involved which has recently started to cause pain, but has had problems with his wisdom teeth as they have come in.  There has been no fevers,  Chills, nausea or vomiting, also no complaint of difficulty swallowing,  Although chewing makes pain worse.  The patient has tried orajel without relief of symptoms.          The history is provided by the patient.    History reviewed. No pertinent past medical history. Past Surgical History  Procedure Laterality Date  . Hand surgery     Family History  Problem Relation Age of Onset  . Diabetes Sister    History  Substance Use Topics  . Smoking status: Current Every Day Smoker -- 2.00 packs/day    Types: Cigarettes  . Smokeless tobacco: Not on file  . Alcohol Use: No     Comment: Occ    Review of Systems  Constitutional: Negative for fever.  HENT: Positive for dental problem. Negative for sore throat, facial swelling, neck pain and neck stiffness.   Respiratory: Negative for shortness of breath.     Allergies  Bee venom and Spider antivenin  Home Medications   Current Outpatient Rx  Name  Route  Sig  Dispense  Refill  . benzocaine (ORAJEL) 10 % mucosal gel   Mouth/Throat   Use as directed in the mouth or throat as needed for pain.         Marland Kitchen amoxicillin (AMOXIL) 500 MG capsule   Oral   Take 1 capsule (500 mg total) by mouth 3 (three) times daily.   30 capsule   0   . traMADol (ULTRAM) 50 MG tablet   Oral   Take 1 tablet (50 mg total) by mouth every 6 (six) hours as needed for  pain.   15 tablet   0    BP 112/47  Pulse 78  Temp(Src) 98.2 F (36.8 C) (Oral)  Resp 20  Ht 6\' 2"  (1.88 m)  Wt 180 lb (81.647 kg)  BMI 23.1 kg/m2  SpO2 100% Physical Exam  Constitutional: He is oriented to person, place, and time. He appears well-developed and well-nourished. No distress.  HENT:  Head: Normocephalic and atraumatic.  Right Ear: Tympanic membrane and external ear normal.  Left Ear: Tympanic membrane and external ear normal.  Mouth/Throat: Oropharynx is clear and moist and mucous membranes are normal. No oral lesions. No trismus in the jaw. No dental abscesses or edematous.    Pain in left upper molars,  Mild edema noted in gingival above these teeth without fluctuance,  No drainage.  3rd molar is erupted,  Appears to be angled toward the 2nd molar.  Eyes: Conjunctivae are normal.  Neck: Normal range of motion. Neck supple.  Cardiovascular: Normal rate and normal heart sounds.   Pulmonary/Chest: Effort normal.  Abdominal: He exhibits no distension.  Musculoskeletal: Normal range of motion.  Lymphadenopathy:    He has no cervical adenopathy.  Neurological: He is alert and oriented to person, place, and time.  Skin: Skin is warm and dry.  No erythema.  Psychiatric: He has a normal mood and affect.    ED Course  Procedures (including critical care time) Labs Review Labs Reviewed - No data to display Imaging Review No results found.  MDM   1. Pain, dental    Probable early infection,  Prescribed amoxil, tramadol,  Dental referrals given.    Burgess Amor, PA-C 11/08/12 [phone]

## 2012-11-08 NOTE — ED Notes (Signed)
Pt reports left side dental pain for 1 week, has not been to his dentist

## 2012-12-24 ENCOUNTER — Encounter (HOSPITAL_COMMUNITY): Payer: Self-pay | Admitting: Emergency Medicine

## 2012-12-24 ENCOUNTER — Emergency Department (HOSPITAL_COMMUNITY)
Admission: EM | Admit: 2012-12-24 | Discharge: 2012-12-24 | Disposition: A | Discharge status: Home / Self Care | Payer: Self-pay | Attending: Emergency Medicine | Admitting: Emergency Medicine

## 2012-12-24 DIAGNOSIS — J020 Streptococcal pharyngitis: Secondary | ICD-10-CM | POA: Insufficient documentation

## 2012-12-24 DIAGNOSIS — R6883 Chills (without fever): Secondary | ICD-10-CM | POA: Insufficient documentation

## 2012-12-24 DIAGNOSIS — F17200 Nicotine dependence, unspecified, uncomplicated: Secondary | ICD-10-CM | POA: Insufficient documentation

## 2012-12-24 DIAGNOSIS — R52 Pain, unspecified: Secondary | ICD-10-CM | POA: Insufficient documentation

## 2012-12-24 DIAGNOSIS — R51 Headache: Secondary | ICD-10-CM | POA: Insufficient documentation

## 2012-12-24 LAB — RAPID STREP SCREEN (MED CTR MEBANE ONLY): Streptococcus, Group A Screen (Direct): POSITIVE — AB

## 2012-12-24 MED ORDER — IBUPROFEN 800 MG PO TABS: 800.0000 mg | ORAL_TABLET | Freq: Three times a day (TID) | ORAL | Status: DC | PRN

## 2012-12-24 MED ORDER — AMOXICILLIN 250 MG PO CAPS
500.0000 mg | ORAL_CAPSULE | Freq: Once | ORAL | Status: AC
  Administered 2012-12-24: 500 mg via ORAL
  Filled 2012-12-24: qty 2

## 2012-12-24 MED ORDER — IBUPROFEN 800 MG PO TABS
800.0000 mg | ORAL_TABLET | Freq: Once | ORAL | Status: AC
  Administered 2012-12-24: 800 mg via ORAL
  Filled 2012-12-24: qty 1

## 2012-12-24 MED ORDER — AMOXICILLIN 500 MG PO CAPS: 500.0000 mg | ORAL_CAPSULE | Freq: Three times a day (TID) | ORAL | Status: DC

## 2012-12-24 NOTE — ED Provider Notes (Signed)
CSN: [id number]     Arrival date & time 12/24/12  0909 History   First MD Initiated Contact with Patient 12/24/12 0915     Chief Complaint  Patient presents with  . Sore Throat   (Consider location/radiation/quality/duration/timing/severity/associated sxs/prior Treatment) HPI Comments: Jim Gay is a 27 y.o. Male presenting with sore throat,  Headache and generalized body aches which started yesterday. His pain is worsened by swallowing.  He denies fevers but has had chills, denies nausea, vomiting, cough, chest pain and shortness of breath.  He has been exposed to his nephew with similar symptoms,  Unsure of his diagnosis. He has taken tylenol without relief of symptoms.     The history is provided by the patient.    History reviewed. No pertinent past medical history. Past Surgical History  Procedure Laterality Date  . Hand surgery     Family History  Problem Relation Age of Onset  . Diabetes Sister    History  Substance Use Topics  . Smoking status: Current Every Day Smoker -- 2.00 packs/day    Types: Cigarettes  . Smokeless tobacco: Not on file  . Alcohol Use: Yes    Review of Systems  Constitutional: Positive for chills. Negative for fever.  HENT: Positive for sore throat. Negative for congestion, ear discharge, ear pain, facial swelling, rhinorrhea, sinus pressure, sneezing, trouble swallowing and voice change.   Eyes: Negative for discharge.  Respiratory: Negative for cough, shortness of breath, wheezing and stridor.   Cardiovascular: Negative for chest pain.  Gastrointestinal: Negative for nausea and abdominal pain.  Genitourinary: Negative.   Neurological: Positive for headaches.    Allergies  Bee venom and Spider antivenin  Home Medications   Current Outpatient Rx  Name  Route  Sig  Dispense  Refill  . acetaminophen (TYLENOL) 500 MG tablet   Oral   Take 1,000 mg by mouth every 6 (six) hours as needed.         Marland Kitchen amoxicillin (AMOXIL) 500 MG  capsule   Oral   Take 1 capsule (500 mg total) by mouth 3 (three) times daily.   29 capsule   0   . ibuprofen (ADVIL,MOTRIN) 800 MG tablet   Oral   Take 1 tablet (800 mg total) by mouth every 8 (eight) hours as needed for fever or moderate pain.   21 tablet   0    BP 123/61  Pulse 93  Temp(Src) 100.2 F (37.9 C) (Oral)  Resp 20  Ht 6\' 1"  (1.854 m)  Wt 180 lb (81.647 kg)  BMI 23.75 kg/m2  SpO2 100% Physical Exam  Constitutional: He is oriented to person, place, and time. He appears well-developed and well-nourished.  HENT:  Head: Normocephalic and atraumatic.  Right Ear: Tympanic membrane, external ear and ear canal normal.  Left Ear: Tympanic membrane, external ear and ear canal normal.  Nose: No mucosal edema or rhinorrhea.  Mouth/Throat: Uvula is midline and mucous membranes are normal. Oropharyngeal exudate, posterior oropharyngeal edema and posterior oropharyngeal erythema present. No tonsillar abscesses.  Eyes: Conjunctivae are normal.  Cardiovascular: Normal rate and normal heart sounds.   Pulmonary/Chest: Effort normal. No respiratory distress. He has no wheezes. He has no rales.  Abdominal: Soft. There is no tenderness.  Musculoskeletal: Normal range of motion.  Neurological: He is alert and oriented to person, place, and time.  Skin: Skin is warm and dry. No rash noted.  Psychiatric: He has a normal mood and affect.    ED Course  Procedures (  including critical care time) Labs Review Labs Reviewed  RAPID STREP SCREEN - Abnormal; Notable for the following:    Streptococcus, Group A Screen (Direct) POSITIVE (*)    All other components within normal limits   Imaging Review No results found.  EKG Interpretation   None       MDM   1. Strep throat    Patients labs and/or radiological studies were viewed and considered during the medical decision making and disposition process. Pt was placed on amoxil, first dose given here.  Ibuprofen,  Rest, increased  fluids.  Prn f/u if sx not improving over the next several days.    Burgess Amor, PA-C 12/24/12 1054

## 2012-12-24 NOTE — ED Provider Notes (Signed)
Medical screening examination/treatment/procedure(s) were performed by non-physician practitioner and as supervising physician I was immediately available for consultation/collaboration.  EKG Interpretation   None        Donnetta Hutching, MD 12/24/12 1552

## 2012-12-24 NOTE — ED Notes (Signed)
Pt c/o sore throat since yesterday.   Denies fever. 

## 2013-02-18 ENCOUNTER — Emergency Department (HOSPITAL_COMMUNITY): Payer: Self-pay

## 2013-02-18 ENCOUNTER — Emergency Department (HOSPITAL_COMMUNITY)
Admission: EM | Admit: 2013-02-18 | Discharge: 2013-02-18 | Disposition: A | Discharge status: Home / Self Care | Payer: Self-pay | Attending: Emergency Medicine | Admitting: Emergency Medicine

## 2013-02-18 ENCOUNTER — Encounter (HOSPITAL_COMMUNITY): Payer: Self-pay | Admitting: Emergency Medicine

## 2013-02-18 DIAGNOSIS — K029 Dental caries, unspecified: Secondary | ICD-10-CM | POA: Insufficient documentation

## 2013-02-18 DIAGNOSIS — F17200 Nicotine dependence, unspecified, uncomplicated: Secondary | ICD-10-CM | POA: Insufficient documentation

## 2013-02-18 MED ORDER — IBUPROFEN 800 MG PO TABS: 800.0000 mg | ORAL_TABLET | Freq: Three times a day (TID) | ORAL | Status: DC | PRN

## 2013-02-18 MED ORDER — IBUPROFEN 800 MG PO TABS
800.0000 mg | ORAL_TABLET | Freq: Once | ORAL | Status: AC
  Administered 2013-02-18: 800 mg via ORAL
  Filled 2013-02-18: qty 1

## 2013-02-18 MED ORDER — AMOXICILLIN 500 MG PO CAPS: 500.0000 mg | ORAL_CAPSULE | Freq: Three times a day (TID) | ORAL | Status: DC

## 2013-02-18 MED ORDER — TRAMADOL HCL 50 MG PO TABS: 50.0000 mg | ORAL_TABLET | Freq: Four times a day (QID) | ORAL | Status: DC | PRN

## 2013-02-18 NOTE — ED Notes (Signed)
Pt reports left side dental pain for 1 week

## 2013-02-18 NOTE — ED Provider Notes (Signed)
CSN: [id number]     Arrival date & time 02/18/13  1543 History   First MD Initiated Contact with Patient 02/18/13 1738     Chief Complaint  Patient presents with  . Dental Pain   (Consider location/radiation/quality/duration/timing/severity/associated sxs/prior Treatment) HPI Comments: Jim Gay is a 28 y.o. Male with a 1 week history of dental pain and gingival swelling.   The patient denies any injury to the tooth involved,  But does feel "something different" when he touches the area with his tongue.  His pain radiates into his left jaw which is worse with attempts to open his mouth.  There has been no fevers,  Chills, nausea or vomiting, also no complaint of difficulty swallowing,  Although chewing makes pain worse.  The patient was taking clindamycin 150 mg bid x 3 days (left over from his mothers prescription) without relief of symptoms.         The history is provided by the patient.    History reviewed. No pertinent past medical history. Past Surgical History  Procedure Laterality Date  . Hand surgery     Family History  Problem Relation Age of Onset  . Diabetes Sister    History  Substance Use Topics  . Smoking status: Current Every Day Smoker -- 2.00 packs/day    Types: Cigarettes  . Smokeless tobacco: Not on file  . Alcohol Use: Yes    Review of Systems  Constitutional: Negative for fever.  HENT: Positive for dental problem. Negative for facial swelling and sore throat.   Respiratory: Negative for shortness of breath.   Musculoskeletal: Negative for neck pain and neck stiffness.    Allergies  Bee venom and Spider antivenin  Home Medications   Current Outpatient Rx  Name  Route  Sig  Dispense  Refill  . acetaminophen (TYLENOL) 500 MG tablet   Oral   Take 1,000 mg by mouth every 6 (six) hours as needed.         Marland Kitchen amoxicillin (AMOXIL) 500 MG capsule   Oral   Take 1 capsule (500 mg total) by mouth 3 (three) times daily.   30 capsule   0   .  ibuprofen (ADVIL,MOTRIN) 800 MG tablet   Oral   Take 1 tablet (800 mg total) by mouth every 8 (eight) hours as needed for fever or moderate pain.   21 tablet   0    BP 111/68  Pulse 82  Temp(Src) 98.8 F (37.1 C) (Oral)  Resp 20  Ht 6' 2.5" (1.892 m)  Wt 173 lb 3 oz (78.557 kg)  BMI 21.95 kg/m2  SpO2 98% Physical Exam  Constitutional: He is oriented to person, place, and time. He appears well-developed and well-nourished. No distress.  HENT:  Head: Normocephalic and atraumatic.  Right Ear: Tympanic membrane and external ear normal.  Left Ear: Tympanic membrane and external ear normal.  Mouth/Throat: Oropharynx is clear and moist and mucous membranes are normal. No oral lesions. No uvula swelling.  Patient is unable to fully open his mouth secondary to pain.  ttp left TM joint.  No edema.  Pt can close mouth completely, unable to open jaw more than 2 cm without increased pain.  There is no edema, fluctuance appreciated,  No gingival or facial swelling.  Eyes: Conjunctivae are normal.  Neck: Normal range of motion. Neck supple.  Cardiovascular: Normal rate and normal heart sounds.   Pulmonary/Chest: Effort normal.  Abdominal: He exhibits no distension.  Musculoskeletal: Normal range of motion.  Lymphadenopathy:    He has no cervical adenopathy.  Neurological: He is alert and oriented to person, place, and time.  Skin: Skin is warm and dry. No erythema.  Psychiatric: He has a normal mood and affect.    ED Course  Procedures (including critical care time) Labs Review Labs Reviewed - No data to display Imaging Review Dg Mandible 4 Views  02/18/2013   CLINICAL DATA:  Left-sided dental pain  EXAM: MANDIBLE - 4+ VIEW  COMPARISON:  None.  FINDINGS: Frontal, submental vertex, right oblique lateral, and left oblique lateral images were obtained. There is a large cavity in the posterior- most molar on the left superiorly. There are several teeth missing.  No fracture or dislocation.  No  erosive change or bony destruction.  IMPRESSION: No bony lesion. Large cavity in the posterior- most molar on the left.   Electronically Signed   By: Bretta BangWilliam  Woodruff M.D.   On: 02/18/2013 18:39    EKG Interpretation   None       MDM   1. Dental cavity    Probable early dental abscess with large cavity seen on plain film. He does describe frequently grinding his teeth, often wakes with sore teeth,  Suspect he may have some aspect of TMJ.  Referrals to dentist given.  Prescribed amoxil, ibuprofen, ultram.  Encouraged recheck here sooner for any worsened sx.    Burgess AmorJulie Judah Carchi, PA-C 02/18/13 1940

## 2013-02-18 NOTE — ED Provider Notes (Signed)
Medical screening examination/treatment/procedure(s) were performed by non-physician practitioner and as supervising physician I was immediately available for consultation/collaboration.  EKG Interpretation   None         Shelda JakesScott W. Allyson Tineo, MD 02/18/13 2118

## 2013-02-18 NOTE — Discharge Instructions (Signed)
Dental Caries  Dental caries (also called tooth decay) is the most common oral disease. It can occur at any age, but is more common in children and young adults.  HOW DENTAL CARIES DEVELOPS  The process of decay begins when bacteria and foods (particularly sugars and starches) combine in your mouth to produce plaque. Plaque is a substance that sticks to the hard, outer surface of a tooth (enamel). The bacteria in plaque produce acids that attack enamel. These acids may also attack the root surface of a tooth (cementum) if it is exposed. Repeated attacks dissolve these surfaces and create holes in the tooth (cavities). If left untreated, the acids destroy the other layers of the tooth.  RISK FACTORS  Frequent sipping of sugary beverages.   Frequent snacking on sugary and starchy foods, especially those that easily get stuck in the teeth.   Poor oral hygiene.   Dry mouth.   Substance abuse such as methamphetamine abuse.   Broken or poor-fitting dental restorations.   Eating disorders.   Gastroesophageal reflux disease (GERD).   Certain radiation treatments to the head and neck. SYMPTOMS In the early stages of dental caries, symptoms are seldom present. Sometimes white, chalky areas may be seen on the enamel or other tooth layers. In later stages, symptoms may include:  Pits and holes on the enamel.  Toothache after sweet, hot, or cold foods or drinks are consumed.  Pain around the tooth.  Swelling around the tooth. DIAGNOSIS  Most of the time, dental caries is detected during a regular dental checkup. A diagnosis is made after a thorough medical and dental history is taken and the surfaces of your teeth are checked for signs of dental caries. Sometimes special instruments, such as lasers, are used to check for dental caries. Dental X-ray exams may be taken so that areas not visible to the eye (such as between the contact areas of the teeth) can be checked for cavities.    TREATMENT  If dental caries is in its early stages, it may be reversed with a fluoride treatment or an application of a remineralizing agent at the dental office. Thorough brushing and flossing at home is needed to aid these treatments. If it is in its later stages, treatment depends on the location and extent of tooth destruction:   If a small area of the tooth has been destroyed, the destroyed area will be removed and cavities will be filled with a material such as gold, silver amalgam, or composite resin.   If a large area of the tooth has been destroyed, the destroyed area will be removed and a cap (crown) will be fitted over the remaining tooth structure.   If the center part of the tooth (pulp) is affected, a procedure called a root canal will be needed before a filling or crown can be placed.   If most of the tooth has been destroyed, the tooth may need to be pulled (extracted). HOME CARE INSTRUCTIONS You can prevent, stop, or reverse dental caries at home by practicing good oral hygiene. Good oral hygiene includes:  Thoroughly cleaning your teeth at least twice a day with a toothbrush and dental floss.   Using a fluoride toothpaste. A fluoride mouth rinse may also be used if recommended by your dentist or health care provider.   Restricting the amount of sugary and starchy foods and sugary liquids you consume.   Avoiding frequent snacking on these foods and sipping of these liquids.   Keeping regular visits  with a dentist for checkups and cleanings. PREVENTION   Practice good oral hygiene.  Consider a dental sealant. A dental sealant is a coating material that is applied by your dentist to the pits and grooves of teeth. The sealant prevents food from being trapped in them. It may protect the teeth for several years.  Ask about fluoride supplements if you live in a community without fluorinated water or with water that has a low fluoride content. Use fluoride supplements  as directed by your dentist or health care provider.  Allow fluoride varnish applications to teeth if directed by your dentist or health care provider. Document Released: 10/22/2001 Document Revised: 10/02/2012 Document Reviewed: 02/02/2012 [facility] Patient Information 2014 [place], [place].   Complete your entire course of antibiotics as prescribed.  Use ibuprofen as prescribed.  You  may use the tramadol for additional pain relief but do not drive within 4 hours of taking as this will make you drowsy.  Avoid applying heat or ice to this abscess area which can worsen your symptoms.  You may use warm salt water swish and spit treatment or half peroxide and water swish and spit after meals to keep this area clean as discussed.  Call the dentist listed above for further management of your symptoms.

## 2013-03-14 ENCOUNTER — Encounter (HOSPITAL_COMMUNITY): Payer: Self-pay | Admitting: Emergency Medicine

## 2013-03-14 ENCOUNTER — Emergency Department (HOSPITAL_COMMUNITY)
Admission: EM | Admit: 2013-03-14 | Discharge: 2013-03-14 | Disposition: A | Discharge status: Home / Self Care | Payer: Self-pay | Attending: Emergency Medicine | Admitting: Emergency Medicine

## 2013-03-14 DIAGNOSIS — Z792 Long term (current) use of antibiotics: Secondary | ICD-10-CM | POA: Insufficient documentation

## 2013-03-14 DIAGNOSIS — J310 Chronic rhinitis: Secondary | ICD-10-CM | POA: Insufficient documentation

## 2013-03-14 DIAGNOSIS — J3489 Other specified disorders of nose and nasal sinuses: Secondary | ICD-10-CM | POA: Insufficient documentation

## 2013-03-14 DIAGNOSIS — K089 Disorder of teeth and supporting structures, unspecified: Secondary | ICD-10-CM | POA: Insufficient documentation

## 2013-03-14 DIAGNOSIS — F17200 Nicotine dependence, unspecified, uncomplicated: Secondary | ICD-10-CM | POA: Insufficient documentation

## 2013-03-14 DIAGNOSIS — R51 Headache: Secondary | ICD-10-CM | POA: Insufficient documentation

## 2013-03-14 DIAGNOSIS — F1410 Cocaine abuse, uncomplicated: Secondary | ICD-10-CM | POA: Insufficient documentation

## 2013-03-14 MED ORDER — FLUTICASONE PROPIONATE 50 MCG/ACT NA SUSP: 2.0000 | Freq: Two times a day (BID) | NASAL | Status: DC | PRN

## 2013-03-14 NOTE — Discharge Instructions (Signed)
Flonase as prescribed as needed for nasal burning or congestion.   Cocaine Cocaine stimulates the central nervous system. As a stimulant, cocaine has the ability to improve athletic performance through increasing speed, endurance, and concentration, as well as decreasing fatigue. Although cocaine may seem to be beneficial for athletics, it is highly addicting and has many debilitating side effects. Cocaine has caused the deaths of many athletes, and its use is banned by every major athletic organization in the world. The clinical effect of cocaine (the high) is very short in duration. Cocaine works in the brain by altering the normal concentrations of chemicals that stimulate the brain cells.  WHY ATHLETES USE IT  Many athletes use cocaine for its central nervous system stimulating properties. It is also used as a recreational drug due to the euphoric felling it produces.  ADVERSE EFFECTS   Sleep disturbances.  Abnormal heart rhythms.  Stroke.  Heart attack.  Seizures.  Elevated blood pressure.  Death.  Paranoia (feeling that people want to hurt you).  Panic attacks (sudden feelings of anxiety or shortness of breath).  Suicidal behavior (wanting to kill yourself).  Homicidal behavior (wanting to kill other people).  Depression (feeling very sad, having decreased energy for activities).  Poor athletic performance. PHARMACOLOGY  Cocaine acts on the body for a short period of time; the clinical effects may last less than1 hour. Since most athletic competitions last for more than 1 hour, cocaine use may not improve athletic performance. The use of cocaine makes individuals much more susceptible for serious conditions such as seizures, arrhythmia (irregular heart beat), and strokes. Even a single dose of cocaine can be detected on a drug test for up to about 30 hours.  PREVENTION Most athletes use cocaine as a recreational drug and not for the purpose of enhancing athletic  performance. To prevent the use of cocaine, athletes must be educated on its side effects and the risk of addiction. If an athlete is found using cocaine, counseling and treatment are almost always required.  Document Released: 01/30/2005 Document Revised: 04/24/2011 Document Reviewed: 05/14/2008 [facility] Patient Information 2014 [place], [place].

## 2013-03-14 NOTE — ED Provider Notes (Signed)
CSN: [id number]     Arrival date & time 03/14/13  [id number] History  This chart was scribed for Geoffery Lyonsouglas Armando Bukhari, MD by Shari HeritageAisha Amuda, ED Scribe. The patient was seen in room APA19/APA19. Patient's care was started at 7:51 AM.    Chief Complaint  Patient presents with  . Drug / Alcohol Assessment   The history is provided by the patient. No language interpreter was used.    HPI Comments: Jim AngJarvis A Gay is a 28 y.o. male who presents to the Emergency Department complaining of severe, constant facial pain and pain in the bilateral nares for the past several hours after snorting powdered cocaine last night. Patient states that he feels he "overdid it." He admits that he was sober for a number of years, but more recently began using cocaine again. He is also complaining of rhinorrhea and teeth pain. He denies chest pain, shortness of breath, fever or chills. He was admitted to the ICU in March 2014 after a drug overdose. He has no chronic medical conditions.  No past medical history on file. Past Surgical History  Procedure Laterality Date  . Hand surgery     Family History  Problem Relation Age of Onset  . Diabetes Sister    History  Substance Use Topics  . Smoking status: Current Every Day Smoker -- 2.00 packs/day    Types: Cigarettes  . Smokeless tobacco: Not on file  . Alcohol Use: Yes    Review of Systems A complete 10 system review of systems was obtained and all systems are negative except as noted in the HPI and PMH.   Allergies  Bee venom and Spider antivenin  Home Medications   Current Outpatient Rx  Name  Route  Sig  Dispense  Refill  . acetaminophen (TYLENOL) 500 MG tablet   Oral   Take 1,000 mg by mouth every 6 (six) hours as needed.         Marland Kitchen. amoxicillin (AMOXIL) 500 MG capsule   Oral   Take 1 capsule (500 mg total) by mouth 3 (three) times daily.   30 capsule   0   . ibuprofen (ADVIL,MOTRIN) 800 MG tablet   Oral   Take 1 tablet (800 mg total) by mouth every 8  (eight) hours as needed for fever or moderate pain.   21 tablet   0   . traMADol (ULTRAM) 50 MG tablet   Oral   Take 1 tablet (50 mg total) by mouth every 6 (six) hours as needed.   15 tablet   0    Triage Vitals: BP 133/76  Temp(Src) 98.3 F (36.8 C) (Oral)  Resp 14  Ht 6\' 2"  (1.88 m)  Wt 175 lb (79.379 kg)  BMI 22.46 kg/m2  SpO2 98% Physical Exam  Constitutional: He is oriented to person, place, and time. He appears well-developed and well-nourished.  HENT:  Head: Normocephalic and atraumatic.  Nares appear inflamed with clear nasal discharge.   Eyes: Conjunctivae and EOM are normal. Pupils are equal, round, and reactive to light.  Neck: Normal range of motion. Neck supple.  No subcutaneous emphysema palpable.  Cardiovascular: Normal rate, regular rhythm and normal heart sounds.   No murmur heard. Pulmonary/Chest: Effort normal and breath sounds normal. No respiratory distress. He has no wheezes. He has no rales.  Musculoskeletal: Normal range of motion.  Neurological: He is alert and oriented to person, place, and time.  Skin: Skin is warm and dry.  Psychiatric: He has a normal mood and  affect. His behavior is normal.    ED Course  Procedures (including critical care time) DIAGNOSTIC STUDIES: Oxygen Saturation is 98% on room air, normal by my interpretation.    COORDINATION OF CARE: 7:55 AM- Will order EKG. Physical exam shows inflamed nares but otherwise patient is not in any acute distress. Patient informed of current plan for treatment and evaluation and agrees with plan at this time.     Labs Review Labs Reviewed - No data to display   Imaging Review No results found.  EKG Interpretation    Date/Time:  Friday March 14 2013 08:00:17 EST Ventricular Rate:  87 PR Interval:  130 QRS Duration: 90 QT Interval:  366 QTC Calculation: 440 R Axis:   74 Text Interpretation:  Normal sinus rhythm Septal infarct , age undetermined Abnormal ECG When compared  with ECG of 14-Apr-2012 15:57, Septal infarct is now Present Confirmed by DELOS  MD, Latona Krichbaum ([id number]) on 03/14/2013 8:18:42 AM            MDM  No diagnosis found. Patient is a 28 year old male who presents with complaints of nasal burning and drainage. He states that he snorted cocaine excessively last night. He denies any chest pain or difficulty breathing. His vitals are stable and he appears nontoxic. EKG does not reveal any acute changes. He will be prescribed Flonase and discharged to home. To return as needed if his symptoms worsen or change.   I personally performed the services described in this documentation, which was scribed in my presence. The recorded information has been reviewed and is accurate.      Geoffery Lyons, MD 03/14/13 [phone]

## 2014-07-23 ENCOUNTER — Emergency Department (HOSPITAL_COMMUNITY)
Admission: EM | Admit: 2014-07-23 | Discharge: 2014-07-23 | Disposition: A | Discharge status: Home / Self Care | Payer: Self-pay | Attending: Emergency Medicine | Admitting: Emergency Medicine

## 2014-07-23 ENCOUNTER — Encounter (HOSPITAL_COMMUNITY): Payer: Self-pay

## 2014-07-23 DIAGNOSIS — Z720 Tobacco use: Secondary | ICD-10-CM | POA: Insufficient documentation

## 2014-07-23 DIAGNOSIS — K08409 Partial loss of teeth, unspecified cause, unspecified class: Secondary | ICD-10-CM | POA: Insufficient documentation

## 2014-07-23 DIAGNOSIS — K047 Periapical abscess without sinus: Secondary | ICD-10-CM | POA: Insufficient documentation

## 2014-07-23 DIAGNOSIS — K0889 Other specified disorders of teeth and supporting structures: Secondary | ICD-10-CM

## 2014-07-23 DIAGNOSIS — K088 Other specified disorders of teeth and supporting structures: Secondary | ICD-10-CM | POA: Insufficient documentation

## 2014-07-23 MED ORDER — HYDROCODONE-ACETAMINOPHEN 5-325 MG PO TABS
1.0000 | ORAL_TABLET | ORAL | Status: DC | PRN
Start: 2014-07-23 — End: 2016-01-03

## 2014-07-23 MED ORDER — HYDROCODONE-ACETAMINOPHEN 5-325 MG PO TABS
1.0000 | ORAL_TABLET | Freq: Once | ORAL | Status: AC
  Administered 2014-07-23: 1 via ORAL
  Filled 2014-07-23: qty 1

## 2014-07-23 NOTE — ED Notes (Addendum)
Reports had # 30 tooth pulled on Tuesday at dental clinic due to abcess. Has been taking amoxicillin. Followed directions per order. Complaining of pain since yesterday

## 2014-07-23 NOTE — Discharge Instructions (Signed)
Dental Pain A tooth ache may be caused by cavities (tooth decay). Cavities expose the nerve of the tooth to air and hot or cold temperatures. It may come from an infection or abscess (also called a boil or furuncle) around your tooth. It is also often caused by dental caries (tooth decay). This causes the pain you are having. DIAGNOSIS  Your caregiver can diagnose this problem by exam. TREATMENT   If caused by an infection, it may be treated with medications which kill germs (antibiotics) and pain medications as prescribed by your caregiver. Take medications as directed.  Only take over-the-counter or prescription medicines for pain, discomfort, or fever as directed by your caregiver.  Whether the tooth ache today is caused by infection or dental disease, you should see your dentist as soon as possible for further care. SEEK MEDICAL CARE IF: The exam and treatment you received today has been provided on an emergency basis only. This is not a substitute for complete medical or dental care. If your problem worsens or new problems (symptoms) appear, and you are unable to meet with your dentist, call or return to this location. SEEK IMMEDIATE MEDICAL CARE IF:   You have a fever.  You develop redness and swelling of your face, jaw, or neck.  You are unable to open your mouth.  You have severe pain uncontrolled by pain medicine. MAKE SURE YOU:   Understand these instructions.  Will watch your condition.  Will get help right away if you are not doing well or get worse. Document Released: 01/30/2005 Document Revised: 04/24/2011 Document Reviewed: 09/18/2007 [facility] Patient Information 2015 [place], [place]. This information is not intended to replace advice given to you by your health care provider. Make sure you discuss any questions you have with your health care provider.  Finish your antibiotics.  You may take the hydrocodone prescribed for pain relief.  This will make you drowsy - do not  drive or operate any dangerous equipment within 4 hours of taking this medication.

## 2014-07-25 NOTE — ED Provider Notes (Signed)
CSN: [id number]     Arrival date & time 07/23/14  0932 History   First MD Initiated Contact with Patient 07/23/14 [phone]     Chief Complaint  Patient presents with  . Dental Pain     (Consider location/radiation/quality/duration/timing/severity/associated sxs/prior Treatment) The history is provided by the patient.   Jim Gay is a 29 y.o. male with mouth pain since having a tooth extracted (#30) through the free clinic 2 days ago.  He has been taking amoxicillin as instructed as there was some infection around the tooth and is using ibuprofen without relief.  He is a smoker but has been careful not to "draw" from the right side of his mouth as he was warned about this causing a dry socket. He denies fevers, chills, mouth swelling, difficulty swallowing.    History reviewed. No pertinent past medical history. Past Surgical History  Procedure Laterality Date  . Hand surgery     Family History  Problem Relation Age of Onset  . Diabetes Sister    History  Substance Use Topics  . Smoking status: Current Every Day Smoker -- 1.00 packs/day    Types: Cigarettes  . Smokeless tobacco: Not on file  . Alcohol Use: Yes     Comment: occassionally    Review of Systems  Constitutional: Negative for fever.  HENT: Positive for dental problem. Negative for facial swelling and sore throat.   Respiratory: Negative for shortness of breath.   Musculoskeletal: Negative for neck pain and neck stiffness.      Allergies  Bee venom and Spider antivenin  Home Medications   Prior to Admission medications   Medication Sig Start Date End Date Taking? Authorizing Provider  ibuprofen (ADVIL,MOTRIN) 200 MG tablet Take 600 mg by mouth every 6 (six) hours as needed for moderate pain.   Yes Historical Provider, MD  HYDROcodone-acetaminophen (NORCO/VICODIN) 5-325 MG per tablet Take 1 tablet by mouth every 4 (four) hours as needed. 07/23/14   Burgess Amor, PA-C   BP 150/76 mmHg  Pulse 90  Temp(Src)  98.3 F (36.8 C) (Oral)  Resp 18  Ht  (1.88 m)  Wt 179 lb (81.194 kg)  BMI 22.97 kg/m2  SpO2 100% Physical Exam  Constitutional: He is oriented to person, place, and time. He appears well-developed and well-nourished. No distress.  HENT:  Head: Normocephalic and atraumatic.  Right Ear: Tympanic membrane and external ear normal.  Left Ear: Tympanic membrane and external ear normal.  Mouth/Throat: Oropharynx is clear and moist and mucous membranes are normal. No oral lesions. No trismus in the jaw. Dental abscesses present.    Eyes: Conjunctivae are normal.  Neck: Normal range of motion. Neck supple.  Cardiovascular: Normal rate and normal heart sounds.   Pulmonary/Chest: Effort normal.  Abdominal: He exhibits no distension.  Musculoskeletal: Normal range of motion.  Lymphadenopathy:    He has no cervical adenopathy.  Neurological: He is alert and oriented to person, place, and time.  Skin: Skin is warm and dry. No erythema.  Psychiatric: He has a normal mood and affect.    ED Course  Procedures (including critical care time) Labs Review Labs Reviewed - No data to display  Imaging Review No results found.   EKG Interpretation None      MDM   Final diagnoses:  Pain, dental    Post extraction dental pain. Pt was prescribed hydrocodone. Advised f/u with dentist if sx persist or worsen.  He was cautioned regarding smoking/using straws as this extraction  site continues to heal.  The patient appears reasonably screened and/or stabilized for discharge and I doubt any other medical condition or other [facility] requiring further screening, evaluation, or treatment in the ED at this time prior to discharge.     Burgess Amor, PA-C 07/25/14 2145  Donnetta Hutching, MD 07/26/14 [phone]

## 2015-02-22 ENCOUNTER — Ambulatory Visit: Payer: Self-pay | Admitting: Physician Assistant

## 2015-03-10 ENCOUNTER — Encounter: Payer: Self-pay | Admitting: Physician Assistant

## 2016-01-03 ENCOUNTER — Encounter (HOSPITAL_COMMUNITY): Payer: Self-pay

## 2016-01-03 ENCOUNTER — Emergency Department (HOSPITAL_COMMUNITY)
Admission: EM | Admit: 2016-01-03 | Discharge: 2016-01-03 | Disposition: A | Discharge status: Home / Self Care | Payer: Self-pay | Attending: Emergency Medicine | Admitting: Emergency Medicine

## 2016-01-03 DIAGNOSIS — F17290 Nicotine dependence, other tobacco product, uncomplicated: Secondary | ICD-10-CM | POA: Insufficient documentation

## 2016-01-03 DIAGNOSIS — N342 Other urethritis: Secondary | ICD-10-CM | POA: Insufficient documentation

## 2016-01-03 DIAGNOSIS — F17210 Nicotine dependence, cigarettes, uncomplicated: Secondary | ICD-10-CM | POA: Insufficient documentation

## 2016-01-03 LAB — URINALYSIS, ROUTINE W REFLEX MICROSCOPIC
Bilirubin Urine: NEGATIVE
Glucose, UA: NEGATIVE mg/dL
Hgb urine dipstick: NEGATIVE
Ketones, ur: NEGATIVE mg/dL
Nitrite: NEGATIVE
PH: 6 (ref 5.0–8.0)
PROTEIN: NEGATIVE mg/dL
Specific Gravity, Urine: 1.02 (ref 1.005–1.030)

## 2016-01-03 LAB — URINE MICROSCOPIC-ADD ON
RBC / HPF: NONE SEEN RBC/hpf (ref 0–5)
Squamous Epithelial / LPF: NONE SEEN

## 2016-01-03 MED ORDER — AZITHROMYCIN 250 MG PO TABS
1000.0000 mg | ORAL_TABLET | Freq: Once | ORAL | Status: AC
  Administered 2016-01-03: 1000 mg via ORAL
  Filled 2016-01-03: qty 4

## 2016-01-03 MED ORDER — CEFTRIAXONE SODIUM 250 MG IJ SOLR
250.0000 mg | Freq: Once | INTRAMUSCULAR | Status: AC
  Administered 2016-01-03: 250 mg via INTRAMUSCULAR
  Filled 2016-01-03: qty 250

## 2016-01-03 MED ORDER — LIDOCAINE HCL (PF) 1 % IJ SOLN
INTRAMUSCULAR | Status: AC
  Administered 2016-01-03: 11:00:00 0.9 mL
  Filled 2016-01-03: qty 5

## 2016-01-03 MED ORDER — LIDOCAINE HCL (PF) 1 % IJ SOLN
0.9000 mL | Freq: Once | INTRAMUSCULAR | Status: AC
  Administered 2016-01-03: 0.9 mL

## 2016-01-03 NOTE — Discharge Instructions (Signed)
You have been treated for the possibilities of gonorrhea and chlamydia today.  Avoid intercourse for the next week, longer if your symptoms persist.  Your partner will need treatment as well as discussed.

## 2016-01-03 NOTE — ED Triage Notes (Signed)
Pt reports painful urination and penile discharge x 1 week.

## 2016-01-03 NOTE — ED Provider Notes (Signed)
[facility]-EMERGENCY DEPT Provider Note   CSN: [id number] Arrival date & time: 01/03/16  0919     History   Chief Complaint Chief Complaint  Patient presents with  . S74.5    HPI Jim Gay is a 30 y.o. male presenting for evaluation of dysuria and penile discharge.  He noticed slight dysuria sx one week ago then developed yellow penile discharge yesterday evening.  He denies fevers, chills, nausea or vomiting, no penile lesions or rash but has had some low pelvic cramping intermittently.  He recently came out of prison and had intercourse with his prior partner.  He is unsure if she has any symptoms.  Pt denies any other symptoms. He has found no alleviators.  The history is provided by the patient.    History reviewed. No pertinent past medical history.  Patient Active Problem List   Diagnosis Date Noted  . 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 Cocaine abuse 04/15/2012    Past Surgical History:  Procedure Laterality Date  . HAND SURGERY         Home Medications    Prior to Admission medications   Not on File    Family History Family History  Problem Relation Age of Onset  . Diabetes Sister     Social History Social History  Substance Use Topics  . Smoking status: Current Every Day Smoker    Packs/day: 1.00    Types: Cigarettes  . Smokeless tobacco: Current User    Types: Snuff  . Alcohol use Yes     Comment: occassionally     Allergies   Bee venom and Spider antivenin [antivenin latrodectus mactans]   Review of Systems Review of Systems  Constitutional: Negative for fever.  HENT: Negative for congestion and sore throat.   Eyes: Negative.   Respiratory: Negative for chest tightness and shortness of breath.   Cardiovascular: Negative for chest pain.  Gastrointestinal: Negative for abdominal pain and nausea.  Genitourinary: Positive for discharge and dysuria. Negative for flank pain, frequency, genital sores, hematuria, penile pain, penile  swelling, scrotal swelling, testicular pain and urgency.  Musculoskeletal: Negative for arthralgias, joint swelling and neck pain.  Skin: Negative.  Negative for rash and wound.  Neurological: Negative for dizziness, weakness, light-headedness, numbness and headaches.  Psychiatric/Behavioral: Negative.      Physical Exam Updated Vital Signs BP 115/60 (BP Location: Left Arm)   Pulse 62   Temp 97.8 F (36.6 C) (Oral)   Resp 16   Ht 6\' 2"  (1.88 m)   Wt 81.6 kg   SpO2 100%   BMI 23.11 kg/m   Physical Exam  Constitutional: He appears well-developed and well-nourished.  HENT:  Head: Normocephalic and atraumatic.  Eyes: Conjunctivae are normal.  Neck: Normal range of motion.  Cardiovascular: Normal rate, regular rhythm and normal heart sounds.   Pulmonary/Chest: Effort normal and breath sounds normal.  Abdominal: Soft. Bowel sounds are normal. He exhibits no distension. There is no tenderness.  Genitourinary: Testes normal. Uncircumcised. Discharge found.  Genitourinary Comments: Chaperone was present during exam.   Musculoskeletal: Normal range of motion.  Neurological: He is alert.  Skin: Skin is warm and dry.  Psychiatric: He has a normal mood and affect.  Nursing note and vitals reviewed.    ED Treatments / Results  Labs (all labs ordered are listed, but only abnormal results are displayed) Labs Reviewed  URINALYSIS, ROUTINE W REFLEX MICROSCOPIC (NOT AT [facility]) - Abnormal; Notable for the following:       Result  Value   Leukocytes, UA TRACE (*)    All other components within normal limits  URINE MICROSCOPIC-ADD ON - Abnormal; Notable for the following:    Bacteria, UA FEW (*)    All other components within normal limits  GC/CHLAMYDIA PROBE AMP ([place]) NOT AT [facility]    EKG  EKG Interpretation None       [facility] No results found.  Procedures Procedures (including critical care time)  Medications Ordered in ED Medications  cefTRIAXone (ROCEPHIN)  injection 250 mg (250 mg Intramuscular Given 01/03/16 1117)  azithromycin (ZITHROMAX) tablet 1,000 mg (1,000 mg Oral Given 01/03/16 1117)  lidocaine (PF) (XYLOCAINE) 1 % injection 0.9 mL (0.9 mLs Other Given 01/03/16 1117)     Initial Impression / Assessment and Plan / ED Course  I have reviewed the triage vital signs and the nursing notes.  Pertinent labs & imaging results that were available during my care of the patient were reviewed by me and considered in my medical decision making (see chart for details).  Clinical Course     Cultures obtained and pending.  Pt was treated with rocephin IM and zithromax 1 gram PO. Advised partner will need tx. Avoid sex x 1 week, longer if sx persist.  Recheck the health department if sx persist, here for any new or worsened sx.   Final Clinical Impressions(s) / ED Diagnoses   Final diagnoses:  Urethritis    New Prescriptions There are no discharge medications for this patient.    Burgess AmorJulie Hadi Dubin, PA-C 01/03/16 1640    Donnetta HutchingBrian Cook, MD 01/04/16 [phone]

## 2016-01-04 LAB — GC/CHLAMYDIA PROBE AMP (~~LOC~~) NOT AT ARMC
Chlamydia: NEGATIVE
Neisseria Gonorrhea: POSITIVE — AB

## 2016-05-25 ENCOUNTER — Emergency Department (HOSPITAL_COMMUNITY): Payer: Self-pay

## 2016-05-25 ENCOUNTER — Emergency Department (HOSPITAL_COMMUNITY)
Admission: EM | Admit: 2016-05-25 | Discharge: 2016-05-25 | Disposition: A | Discharge status: Home / Self Care | Payer: Self-pay | Attending: Emergency Medicine | Admitting: Emergency Medicine

## 2016-05-25 ENCOUNTER — Encounter (HOSPITAL_COMMUNITY): Payer: Self-pay | Admitting: Emergency Medicine

## 2016-05-25 DIAGNOSIS — F17290 Nicotine dependence, other tobacco product, uncomplicated: Secondary | ICD-10-CM | POA: Insufficient documentation

## 2016-05-25 DIAGNOSIS — J029 Acute pharyngitis, unspecified: Secondary | ICD-10-CM | POA: Insufficient documentation

## 2016-05-25 DIAGNOSIS — F1290 Cannabis use, unspecified, uncomplicated: Secondary | ICD-10-CM | POA: Insufficient documentation

## 2016-05-25 DIAGNOSIS — F17210 Nicotine dependence, cigarettes, uncomplicated: Secondary | ICD-10-CM | POA: Insufficient documentation

## 2016-05-25 DIAGNOSIS — R042 Hemoptysis: Secondary | ICD-10-CM | POA: Insufficient documentation

## 2016-05-25 DIAGNOSIS — F1490 Cocaine use, unspecified, uncomplicated: Secondary | ICD-10-CM | POA: Insufficient documentation

## 2016-05-25 LAB — RAPID STREP SCREEN (MED CTR MEBANE ONLY): Streptococcus, Group A Screen (Direct): NEGATIVE

## 2016-05-25 MED ORDER — AZITHROMYCIN 250 MG PO TABS: 250.0000 mg | ORAL_TABLET | Freq: Every day | ORAL | 0 refills | Status: AC

## 2016-05-25 MED ORDER — AZITHROMYCIN 250 MG PO TABS
500.0000 mg | ORAL_TABLET | Freq: Once | ORAL | Status: AC
  Administered 2016-05-25: 500 mg via ORAL
  Filled 2016-05-25: qty 2

## 2016-05-25 NOTE — ED Provider Notes (Signed)
[facility]-EMERGENCY DEPT Provider Note   CSN: [id number] Arrival date & time: 05/25/16  0907     History   Chief Complaint Chief Complaint  Patient presents with  . Sore Throat    HPI Jim Gay is a 31 y.o. male who woke this am with sore throat and a cough which has been productive x 1 with blood tinged sputum.  He denies shortness of breath, fevers, chills, chest pain, n/v/ , weakness or unexplained weight loss.  He has mild nasal congestion with clear drainage, denies epistaxis or sinus pain.  He has had no medications prior to arrival for his symptoms. No known strep throat exposures. He is a 1.5 ppd smoker.  The history is provided by the patient.    History reviewed. No pertinent past medical history.  Patient Active Problem List   Diagnosis Date Noted  . 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 Cocaine abuse 04/15/2012    Past Surgical History:  Procedure Laterality Date  . HAND SURGERY         Home Medications    Prior to Admission medications   Medication Sig Start Date End Date Taking? Authorizing Provider  azithromycin (ZITHROMAX) 250 MG tablet Take 1 tablet (250 mg total) by mouth daily. Take one tablet daily for 4 days 05/25/16   Burgess Amor, PA-C    Family History Family History  Problem Relation Age of Onset  . Diabetes Sister     Social History Social History  Substance Use Topics  . Smoking status: Current Every Day Smoker    Packs/day: 1.00    Types: Cigarettes  . Smokeless tobacco: Current User    Types: Snuff  . Alcohol use Yes     Comment: occassionally     Allergies   Bee venom and Spider antivenin [black widow spider antivenin (l.mactans)]   Review of Systems Review of Systems  Constitutional: Negative for chills and fever.  HENT: Positive for rhinorrhea and sore throat. Negative for congestion, ear pain, sinus pressure, trouble swallowing and voice change.   Eyes: Negative for discharge.  Respiratory: Positive for cough.  Negative for shortness of breath, wheezing and stridor.   Cardiovascular: Negative for chest pain.  Gastrointestinal: Negative for abdominal pain.  Genitourinary: Negative.      Physical Exam Updated Vital Signs BP 106/60   Pulse 63   Temp 98 F (36.7 C) (Oral)   Resp 20   Ht  (1.88 m)   Wt 79.8 kg   SpO2 100%   BMI 22.60 kg/m   Physical Exam  Constitutional: He is oriented to person, place, and time. He appears well-developed and well-nourished.  HENT:  Head: Normocephalic and atraumatic.  Right Ear: Tympanic membrane and ear canal normal.  Left Ear: Tympanic membrane and ear canal normal.  Nose: Mucosal edema and rhinorrhea present. No epistaxis.  Mouth/Throat: Uvula is midline and mucous membranes are normal. Posterior oropharyngeal erythema present. No oropharyngeal exudate, posterior oropharyngeal edema or tonsillar abscesses.  Eyes: Conjunctivae are normal.  Cardiovascular: Normal rate and normal heart sounds.   Pulmonary/Chest: Effort normal. No respiratory distress. He has no decreased breath sounds. He has no wheezes. He has no rhonchi. He has no rales.  Abdominal: Soft. There is no tenderness.  Musculoskeletal: Normal range of motion.  Neurological: He is alert and oriented to person, place, and time.  Skin: Skin is warm and dry. No rash noted. Nails show clubbing.  Psychiatric: He has a normal mood and affect.  ED Treatments / Results  Labs (all labs ordered are listed, but only abnormal results are displayed) Labs Reviewed  RAPID STREP SCREEN (NOT AT [facility])  CULTURE, GROUP A STREP [facility])    EKG  EKG Interpretation None       Radiology Dg Chest 2 View  Result Date: 05/25/2016 CLINICAL DATA:  Productive cough and hemoptysis, sore throat, current smoker. EXAM: CHEST  2 VIEW COMPARISON:  Chest x-ray of April 14, 2012 and chest CT scan of April 15, 2012. FINDINGS: The lungs are well-expanded and clear. The heart and pulmonary vascularity are  normal. The mediastinum is normal in width. There is no pleural effusion. The bony thorax exhibits no acute abnormality. IMPRESSION: There is no active cardiopulmonary disease. If the patient's hemoptysis persists and acute sinusitis has been excluded, chest CT scanning would be the most useful next imaging step. Electronically Signed   By: David  [place] M.D.   On: 05/25/2016 10:08    Procedures Procedures (including critical care time)  Medications Ordered in ED Medications  azithromycin (ZITHROMAX) tablet 500 mg (500 mg Oral Given 05/25/16 1109)     Initial Impression / Assessment and Plan / ED Course  I have reviewed the triage vital signs and the nursing notes.  Pertinent labs & imaging results that were available during my care of the patient were reviewed by me and considered in my medical decision making (see chart for details).     Imaging reviewed and discussed with patient including need for further imaging if hemoptysis persists. He was counseled re smoking hazards.  Referral given for establishing pcp, started zithromax given smoking hx. Advised motrin or tylenol for throat pain relief, lozenges, salt gargles.  Pt with normal VS, no c/o cp or sob.  PE unlikely.  Final Clinical Impressions(s) / ED Diagnoses   Final diagnoses:  Viral pharyngitis  Cough with hemoptysis    New Prescriptions Discharge Medication List as of 05/25/2016 10:49 AM    START taking these medications   Details  azithromycin (ZITHROMAX) 250 MG tablet Take 1 tablet (250 mg total) by mouth daily. Take one tablet daily for 4 days, Starting Thu 05/25/2016, Print         Burgess Amor, PA-C 05/27/16 4098    Bethann Berkshire, MD 05/27/16 [phone]

## 2016-05-25 NOTE — ED Triage Notes (Signed)
Woke up this am with sore throat and cough.  Cough is blood-tinged.

## 2016-05-25 NOTE — Discharge Instructions (Signed)
Take you next dose of antibiotic tomorrow morning.  I recommend Tylenol or Motrin for throat pain relief.  You may also try warm salt water gargles which can help your throat pain heal sooner.  Your strep test is negative and your chest xray is clear.

## 2016-05-27 LAB — CULTURE, GROUP A STREP (THRC)

## 2016-10-07 ENCOUNTER — Encounter (HOSPITAL_COMMUNITY): Payer: Self-pay

## 2016-10-07 ENCOUNTER — Emergency Department (HOSPITAL_COMMUNITY)
Admission: EM | Admit: 2016-10-07 | Discharge: 2016-10-07 | Disposition: A | Discharge status: Home / Self Care | Payer: Self-pay | Attending: Emergency Medicine | Admitting: Emergency Medicine

## 2016-10-07 DIAGNOSIS — J039 Acute tonsillitis, unspecified: Secondary | ICD-10-CM | POA: Insufficient documentation

## 2016-10-07 DIAGNOSIS — Z87891 Personal history of nicotine dependence: Secondary | ICD-10-CM | POA: Insufficient documentation

## 2016-10-07 LAB — RAPID STREP SCREEN (MED CTR MEBANE ONLY): Streptococcus, Group A Screen (Direct): NEGATIVE

## 2016-10-07 MED ORDER — CLINDAMYCIN HCL 150 MG PO CAPS
450.0000 mg | ORAL_CAPSULE | Freq: Once | ORAL | Status: AC
  Administered 2016-10-07: 450 mg via ORAL
  Filled 2016-10-07: qty 3

## 2016-10-07 MED ORDER — CLINDAMYCIN HCL 150 MG PO CAPS: 450.0000 mg | ORAL_CAPSULE | Freq: Three times a day (TID) | ORAL | 0 refills | Status: AC

## 2016-10-07 MED ORDER — ACETAMINOPHEN 325 MG PO TABS
ORAL_TABLET | ORAL | Status: DC
Start: 2016-10-07 — End: 2016-10-08
  Filled 2016-10-07: qty 2

## 2016-10-07 MED ORDER — ACETAMINOPHEN 325 MG PO TABS
650.0000 mg | ORAL_TABLET | Freq: Once | ORAL | Status: AC | PRN
  Administered 2016-10-07: 650 mg via ORAL

## 2016-10-07 NOTE — ED Notes (Signed)
PT states understanding of care given, follow up care, and medication prescribed. PT ambulated from ED to car with a steady gait. 

## 2016-10-07 NOTE — ED Provider Notes (Signed)
[facility]-EMERGENCY DEPT Provider Note   CSN: [id number] Arrival date & time: 10/07/16  1911     History   Chief Complaint Chief Complaint  Patient presents with  . Sore Throat    HPI Jim Gay is a 31 y.o. male presenting with sore throat 3 days.  Patient states that for the past 3 days, he's had throat pain. Pain began on the left side, and has now spread to the right. He has been taking ibuprofen without relief. He reports increased sweating at night, and intermittent chills throughout the day. He has associated voice change, stating it sounds deeper than normal. He can handle his secretions without difficulty, but has pain when he swallows. He reports several sick contacts. Denies headache, vision changes, congestion, ear pain, cough, chest pain, shortness of breath, nausea, vomiting, abdominal pain. He has no trismus, neck pain, or pain under his tongue. He denies immunocompromised status. He has never had a bad reaction to antibiotics.  HPI  History reviewed. No pertinent past medical history.  Patient Active Problem List   Diagnosis Date Noted  . 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 Cocaine abuse 04/15/2012    Past Surgical History:  Procedure Laterality Date  . HAND SURGERY         Home Medications    Prior to Admission medications   Medication Sig Start Date End Date Taking? Authorizing Provider  azithromycin (ZITHROMAX) 250 MG tablet Take 1 tablet (250 mg total) by mouth daily. Take one tablet daily for 4 days 05/25/16   Burgess Amor, PA-C  clindamycin (CLEOCIN) 150 MG capsule Take 3 capsules (450 mg total) by mouth 3 (three) times daily. 10/07/16 10/14/16  Karisha Marlin, PA-C    Family History Family History  Problem Relation Age of Onset  . Diabetes Sister     Social History Social History  Substance Use Topics  . Smoking status: Former Smoker    Packs/day: 1.00    Types: Cigarettes    Quit date: 07/24/2016  . Smokeless tobacco: Current User   Types: Snuff  . Alcohol use No     Allergies   Bee venom and Spider antivenin [black widow spider antivenin (l.mactans)]   Review of Systems Review of Systems  Constitutional: Positive for chills and fever.  HENT: Positive for sore throat and voice change. Negative for congestion, facial swelling, sinus pain, sinus pressure, tinnitus and trouble swallowing.   Eyes: Negative for pain.  Respiratory: Negative for cough, chest tightness and shortness of breath.   Cardiovascular: Negative for chest pain.  Gastrointestinal: Negative for abdominal pain, diarrhea, nausea and vomiting.     Physical Exam Updated Vital Signs BP 120/64 (BP Location: Right Arm)   Pulse 81   Temp 98.5 F (36.9 C) (Oral)   Resp 16   SpO2 100%   Physical Exam  Constitutional: He is oriented to person, place, and time. He appears well-developed and well-nourished. No distress.  HENT:  Head: Normocephalic and atraumatic.  Right Ear: Tympanic membrane, external ear and ear canal normal.  Left Ear: Tympanic membrane, external ear and ear canal normal.  Nose: Nose normal. Right sinus exhibits no maxillary sinus tenderness and no frontal sinus tenderness. Left sinus exhibits no maxillary sinus tenderness and no frontal sinus tenderness.  Mouth/Throat: Uvula is midline and mucous membranes are normal. No trismus in the jaw. No uvula swelling. Posterior oropharyngeal erythema present. Tonsillar exudate.  Patchy exudate on left tonsil and swelling of tonsils bilaterally. Uvula midline. No palate deviation. No pain  under the tongue. No trismus.  Eyes: Pupils are equal, round, and reactive to light. Conjunctivae and EOM are normal.  Neck: Normal range of motion.  Cardiovascular: Normal rate, regular rhythm and intact distal pulses.   Pulmonary/Chest: Effort normal and breath sounds normal. No respiratory distress. He has no wheezes. He has no rales.  Abdominal: He exhibits no distension. There is no tenderness. There  is no guarding.  Musculoskeletal: Normal range of motion.  Lymphadenopathy:    He has no cervical adenopathy.  Neurological: He is alert and oriented to person, place, and time.  Skin: Skin is warm. No rash noted.  Psychiatric: He has a normal mood and affect.  Nursing note and vitals reviewed.    ED Treatments / Results  Labs (all labs ordered are listed, but only abnormal results are displayed) Labs Reviewed  RAPID STREP SCREEN (NOT AT [facility])  CULTURE, GROUP A STREP [facility])    EKG  EKG Interpretation None       Radiology No results found.  Procedures Procedures (including critical care time)  Medications Ordered in ED Medications  acetaminophen (TYLENOL) tablet 650 mg (650 mg Oral Given 10/07/16 1927)  clindamycin (CLEOCIN) capsule 450 mg (450 mg Oral Given 10/07/16 2115)     Initial Impression / Assessment and Plan / ED Course  I have reviewed the triage vital signs and the nursing notes.  Pertinent labs & imaging results that were available during my care of the patient were reviewed by me and considered in my medical decision making (see chart for details).     Patient presenting with 3 days sore throat. Physical exam shows patchy exudate on his left tonsil. No obvious abscess. Patient with voice change, but no trismus, uvula deviation, or unequal palate rise. Doubt peritonsillar abscess at this time. Rapid strep negative. Will give first dose of antibiotics here, and discharge patient with prescription for clindamycin. At this time, patient appears safe for discharge. Return precautions given. Patient states he understands and agrees to plan.  Final Clinical Impressions(s) / ED Diagnoses   Final diagnoses:  Tonsillitis    New Prescriptions Discharge Medication List as of 10/07/2016  9:22 PM    START taking these medications   Details  clindamycin (CLEOCIN) 150 MG capsule Take 3 capsules (450 mg total) by mouth 3 (three) times daily., Starting Sat 10/07/2016,  Until Sat 10/14/2016, Print         [place], [place], PA-C 10/08/16 1610    Rolland Porter, MD 10/17/16 [phone]

## 2016-10-07 NOTE — Discharge Instructions (Signed)
Take antibiotics as prescribed.  Stay well hydrated with water.  Use ibuprofen as needed for fevers and throat pain.  Return to the ER if you develop fevers, worsening pain, or worsening swelling after being on antibiotics for more than 24 hours.  Return to the ER if you have any difficulty breathing, are unable to handle your secretions, or have worsening pain.

## 2016-10-07 NOTE — ED Triage Notes (Signed)
Onset 3 days sore throat, hoarse, chills, sweating.

## 2016-10-09 LAB — CULTURE, GROUP A STREP (THRC)

## 2017-10-03 ENCOUNTER — Emergency Department (HOSPITAL_COMMUNITY)
Admission: EM | Admit: 2017-10-03 | Discharge: 2017-10-03 | Disposition: A | Discharge status: Home / Self Care | Payer: Self-pay | Attending: Emergency Medicine | Admitting: Emergency Medicine

## 2017-10-03 ENCOUNTER — Other Ambulatory Visit: Payer: Self-pay

## 2017-10-03 ENCOUNTER — Encounter (HOSPITAL_COMMUNITY): Payer: Self-pay | Admitting: Emergency Medicine

## 2017-10-03 DIAGNOSIS — Z5321 Procedure and treatment not carried out due to patient leaving prior to being seen by health care provider: Secondary | ICD-10-CM | POA: Insufficient documentation

## 2017-10-03 DIAGNOSIS — R21 Rash and other nonspecific skin eruption: Secondary | ICD-10-CM | POA: Insufficient documentation

## 2017-10-03 NOTE — ED Triage Notes (Signed)
Pt reports used wart remover on wart on right ring finger. Pt wants to have it checked to make sure its not infected. Denies fever.

## 2017-10-05 NOTE — ED Notes (Signed)
Follow up call made  Will return if needed  10/05/17 0830  s Regginald Pask rn

## 2018-09-01 IMAGING — DX DG CHEST 2V
2 series · 2 of 2 positions shown · non-contrast
Comparison: Chest x-ray of April 14, 2012 and chest CT scan of April 15, 2012.

CLINICAL DATA: Productive cough and hemoptysis, sore throat,
current smoker.

EXAM:
CHEST  2 VIEW

[chest pa]
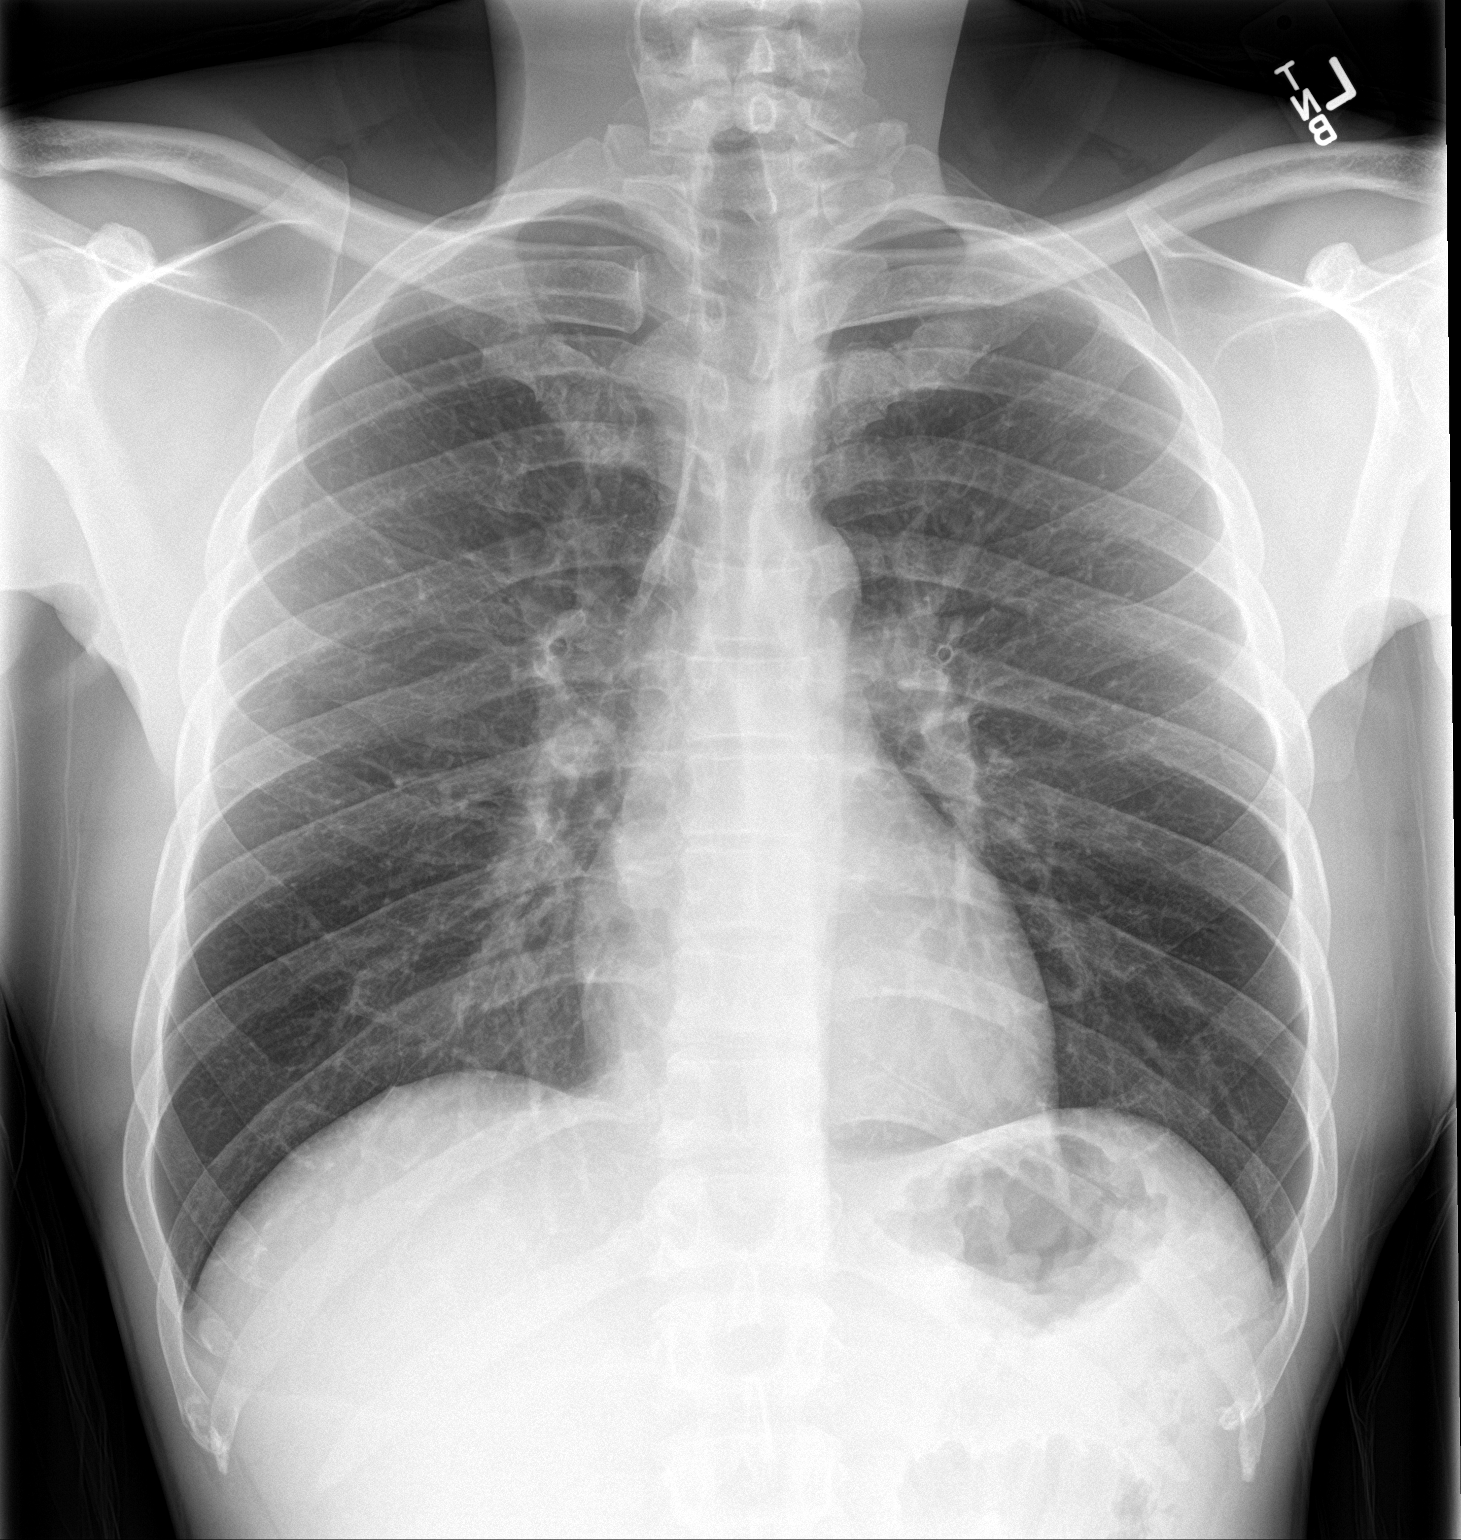

[chest lat]
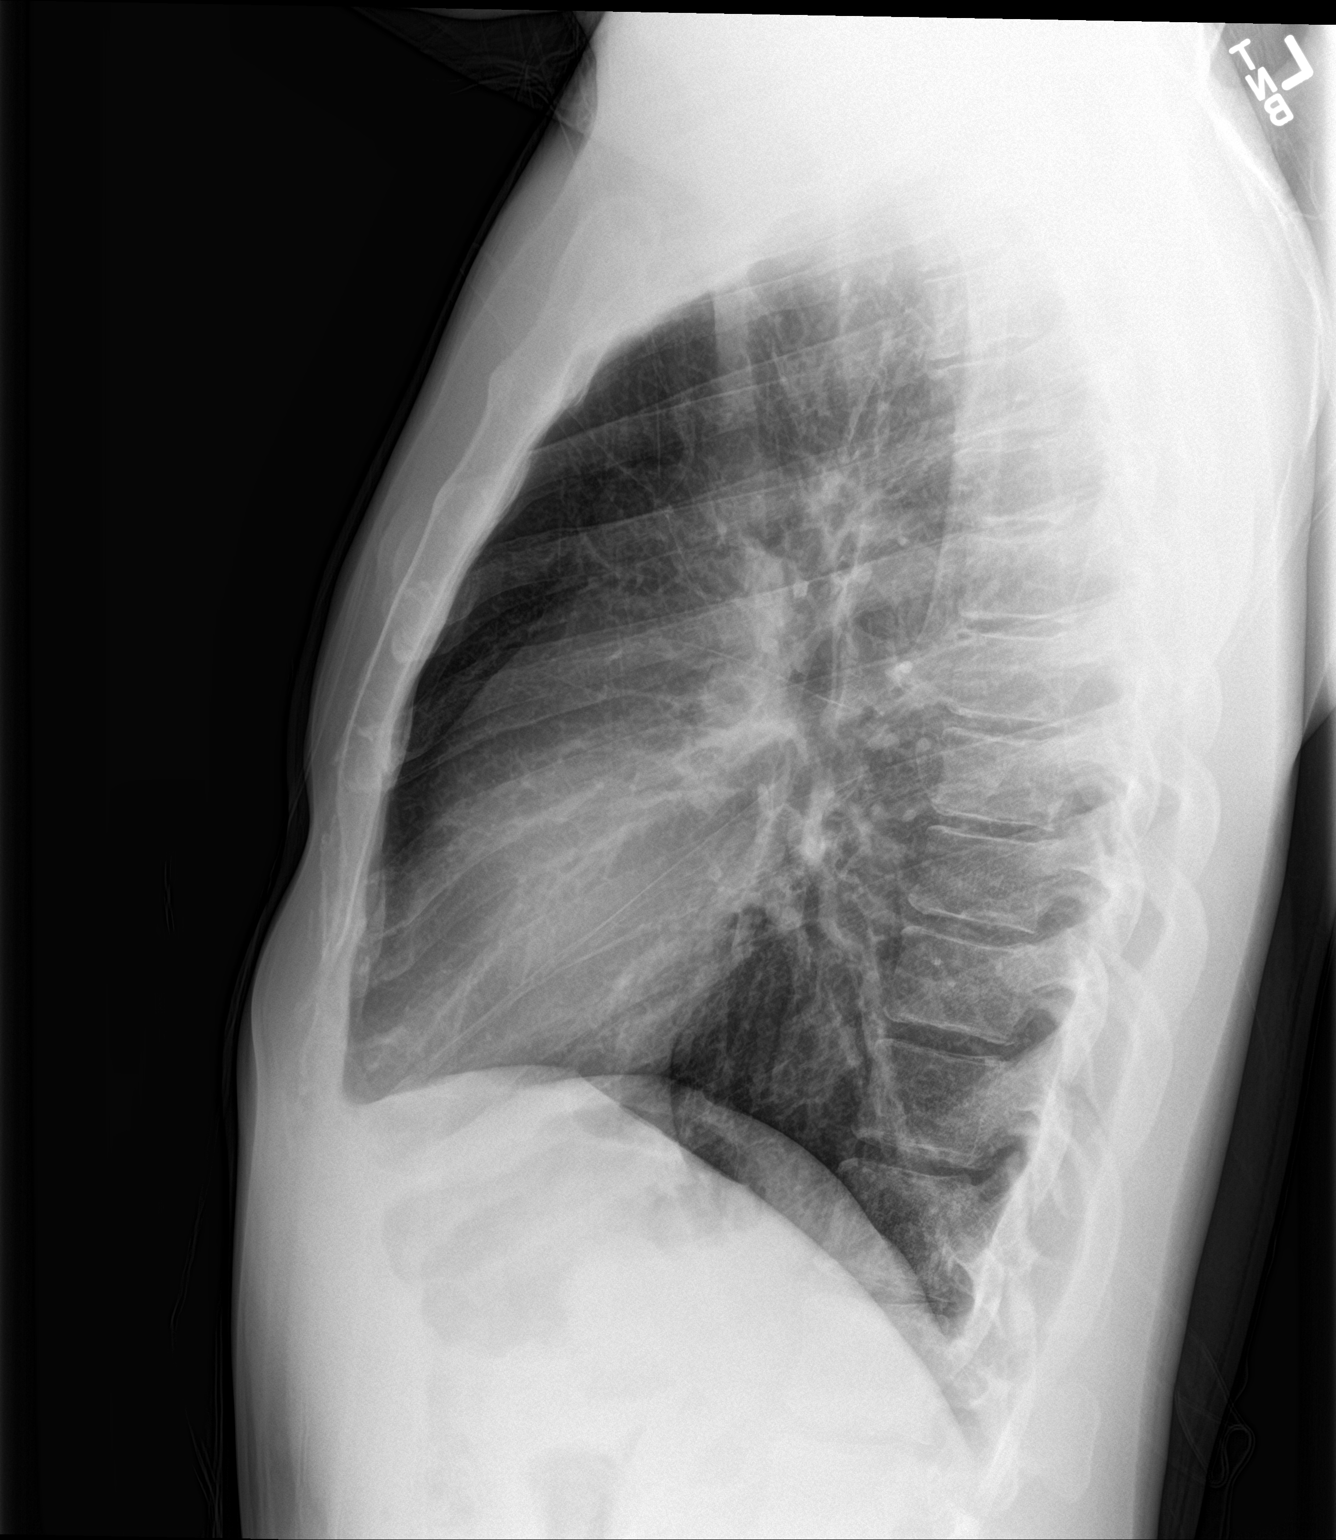

[2 of 2 positions shown; findings below may reference images not displayed]

FINDINGS: The lungs are well-expanded and clear. The heart and pulmonary
vascularity are normal. The mediastinum is normal in width. There is
no pleural effusion. The bony thorax exhibits no acute abnormality.
IMPRESSION: There is no active cardiopulmonary disease. If the patient's
hemoptysis persists and acute sinusitis has been excluded, chest CT
scanning would be the most useful next imaging step.

## 2022-08-14 DEATH — deceased
